# Patient Record
Sex: Male | Born: 1940 | Race: White | Hispanic: No | Marital: Married
Health system: Southern US, Community
[De-identification: ages and names within clinical notes are randomized; demographics above are authoritative.]

---

## 2004-06-09 ENCOUNTER — Inpatient Hospital Stay (HOSPITAL_COMMUNITY): Admission: RE | Admit: 2004-06-09 | Discharge: 2004-06-15 | Payer: Self-pay | Admitting: Surgery

## 2004-09-12 ENCOUNTER — Encounter: Payer: Self-pay | Admitting: *Deleted

## 2004-09-28 ENCOUNTER — Encounter: Payer: Self-pay | Admitting: *Deleted

## 2007-06-25 ENCOUNTER — Ambulatory Visit: Payer: Self-pay | Admitting: Urology

## 2007-06-30 ENCOUNTER — Other Ambulatory Visit: Payer: Self-pay

## 2007-06-30 ENCOUNTER — Emergency Department: Payer: Self-pay | Admitting: Emergency Medicine

## 2007-07-03 ENCOUNTER — Ambulatory Visit: Payer: Self-pay | Admitting: Urology

## 2007-07-11 ENCOUNTER — Ambulatory Visit: Payer: Self-pay | Admitting: Urology

## 2007-07-18 ENCOUNTER — Ambulatory Visit: Payer: Self-pay | Admitting: Urology

## 2007-07-20 ENCOUNTER — Emergency Department: Payer: Self-pay

## 2007-10-20 ENCOUNTER — Ambulatory Visit: Payer: Self-pay | Admitting: Urology

## 2009-11-23 ENCOUNTER — Ambulatory Visit: Payer: Self-pay | Admitting: Ophthalmology

## 2009-11-23 ENCOUNTER — Ambulatory Visit: Payer: Self-pay | Admitting: Internal Medicine

## 2009-11-24 ENCOUNTER — Ambulatory Visit: Payer: Self-pay | Admitting: Ophthalmology

## 2009-12-07 ENCOUNTER — Ambulatory Visit: Payer: Self-pay | Admitting: Ophthalmology

## 2010-05-18 ENCOUNTER — Ambulatory Visit: Payer: Self-pay | Admitting: Unknown Physician Specialty

## 2010-05-19 LAB — PATHOLOGY REPORT

## 2010-10-17 ENCOUNTER — Other Ambulatory Visit: Payer: Self-pay | Admitting: Podiatry

## 2010-10-25 ENCOUNTER — Inpatient Hospital Stay: Payer: Self-pay | Admitting: Podiatry

## 2010-11-14 ENCOUNTER — Ambulatory Visit: Payer: Self-pay | Admitting: Vascular Surgery

## 2010-11-23 ENCOUNTER — Ambulatory Visit: Payer: Self-pay | Admitting: Podiatry

## 2010-11-24 ENCOUNTER — Ambulatory Visit: Payer: Self-pay | Admitting: Podiatry

## 2010-12-22 ENCOUNTER — Ambulatory Visit: Payer: Self-pay | Admitting: Podiatry

## 2010-12-25 LAB — PATHOLOGY REPORT

## 2011-01-12 ENCOUNTER — Ambulatory Visit: Payer: Self-pay | Admitting: Podiatry

## 2011-01-19 ENCOUNTER — Ambulatory Visit: Payer: Self-pay | Admitting: Podiatry

## 2011-01-23 LAB — PATHOLOGY REPORT

## 2011-09-13 ENCOUNTER — Ambulatory Visit: Payer: Self-pay | Admitting: Urology

## 2011-09-17 ENCOUNTER — Ambulatory Visit: Payer: Self-pay | Admitting: Urology

## 2011-10-10 ENCOUNTER — Ambulatory Visit: Payer: Self-pay | Admitting: Vascular Surgery

## 2011-10-19 ENCOUNTER — Ambulatory Visit: Payer: Self-pay | Admitting: Podiatry

## 2011-10-24 LAB — PATHOLOGY REPORT

## 2011-10-27 ENCOUNTER — Inpatient Hospital Stay: Payer: Self-pay | Admitting: Internal Medicine

## 2011-10-28 DIAGNOSIS — R031 Nonspecific low blood-pressure reading: Secondary | ICD-10-CM

## 2011-10-29 DIAGNOSIS — R031 Nonspecific low blood-pressure reading: Secondary | ICD-10-CM

## 2011-10-30 LAB — BASIC METABOLIC PANEL
Anion Gap: 15 (ref 7–16)
Calcium, Total: 8.4 mg/dL — ABNORMAL LOW (ref 8.5–10.1)
Creatinine: 2.57 mg/dL — ABNORMAL HIGH (ref 0.60–1.30)
EGFR (African American): 32 — ABNORMAL LOW
EGFR (Non-African Amer.): 27 — ABNORMAL LOW
Glucose: 143 mg/dL — ABNORMAL HIGH (ref 65–99)
Sodium: 145 mmol/L (ref 136–145)

## 2011-10-30 LAB — PROTEIN / CREATININE RATIO, URINE: Protein, Random Urine: 117 mg/dL — ABNORMAL HIGH (ref 0–12)

## 2011-10-30 LAB — CBC WITH DIFFERENTIAL/PLATELET
Basophil #: 0 10*3/uL (ref 0.0–0.1)
Eosinophil #: 0 10*3/uL (ref 0.0–0.7)
HCT: 33.6 % — ABNORMAL LOW (ref 40.0–52.0)
Lymphocyte %: 12.3 %
MCHC: 33.3 g/dL (ref 32.0–36.0)
Monocyte %: 9.2 %
Neutrophil %: 77.6 %
RDW: 15 % — ABNORMAL HIGH (ref 11.5–14.5)

## 2011-10-30 LAB — APTT
Activated PTT: 115.3 secs — ABNORMAL HIGH (ref 23.6–35.9)
Activated PTT: >160 secs (ref 23.6–35.9)

## 2011-10-30 LAB — PROTIME-INR: INR: 2.6

## 2011-10-31 LAB — PATHOLOGY REPORT

## 2011-10-31 LAB — CBC WITH DIFFERENTIAL/PLATELET
Comment - H1-Com2: NORMAL
HCT: 32 % — ABNORMAL LOW (ref 40.0–52.0)
MCH: 28.6 pg (ref 26.0–34.0)
MCHC: 33.1 g/dL (ref 32.0–36.0)
RDW: 15.1 % — ABNORMAL HIGH (ref 11.5–14.5)
Segmented Neutrophils: 81 %

## 2011-10-31 LAB — BASIC METABOLIC PANEL
Chloride: 103 mmol/L (ref 98–107)
Co2: 24 mmol/L (ref 21–32)
Creatinine: 3.37 mg/dL — ABNORMAL HIGH (ref 0.60–1.30)
Potassium: 3.9 mmol/L (ref 3.5–5.1)
Sodium: 141 mmol/L (ref 136–145)

## 2011-10-31 LAB — PROTIME-INR
INR: 2.9
Prothrombin Time: 29.4 secs — ABNORMAL HIGH (ref 11.5–14.7)

## 2011-10-31 LAB — APTT: Activated PTT: 104 secs — ABNORMAL HIGH (ref 23.6–35.9)

## 2011-10-31 LAB — MAGNESIUM: Magnesium: 2.3 mg/dL

## 2011-11-01 LAB — CBC WITH DIFFERENTIAL/PLATELET
Basophil %: 0.3 %
Eosinophil #: 0.1 10*3/uL (ref 0.0–0.7)
HCT: 30.8 % — ABNORMAL LOW (ref 40.0–52.0)
HGB: 10.2 g/dL — ABNORMAL LOW (ref 13.0–18.0)
Lymphocyte %: 12.3 %
MCH: 28.7 pg (ref 26.0–34.0)
MCHC: 33.1 g/dL (ref 32.0–36.0)
Monocyte #: 0.6 10*3/uL (ref 0.0–0.7)
Neutrophil #: 5.9 10*3/uL (ref 1.4–6.5)

## 2011-11-01 LAB — BASIC METABOLIC PANEL
Anion Gap: 15 (ref 7–16)
BUN: 64 mg/dL — ABNORMAL HIGH (ref 7–18)
Chloride: 108 mmol/L — ABNORMAL HIGH (ref 98–107)
Creatinine: 3.59 mg/dL — ABNORMAL HIGH (ref 0.60–1.30)
EGFR (African American): 22 — ABNORMAL LOW
Osmolality: 315 (ref 275–301)

## 2011-11-01 LAB — APTT: Activated PTT: 145.4 secs — ABNORMAL HIGH (ref 23.6–35.9)

## 2011-11-02 LAB — BASIC METABOLIC PANEL
Anion Gap: 13 (ref 7–16)
BUN: 62 mg/dL — ABNORMAL HIGH (ref 7–18)
Calcium, Total: 8.4 mg/dL — ABNORMAL LOW (ref 8.5–10.1)
EGFR (African American): 26 — ABNORMAL LOW
EGFR (Non-African Amer.): 21 — ABNORMAL LOW
Glucose: 338 mg/dL — ABNORMAL HIGH (ref 65–99)
Osmolality: 320 (ref 275–301)

## 2011-11-03 LAB — CBC WITH DIFFERENTIAL/PLATELET
Basophil %: 0.3 %
Eosinophil #: 0.1 10*3/uL (ref 0.0–0.7)
Eosinophil %: 1.2 %
HGB: 10.5 g/dL — ABNORMAL LOW (ref 13.0–18.0)
Lymphocyte #: 1 10*3/uL (ref 1.0–3.6)
MCH: 28.7 pg (ref 26.0–34.0)
MCHC: 33.5 g/dL (ref 32.0–36.0)
MCV: 86 fL (ref 80–100)
Monocyte #: 0.6 10*3/uL (ref 0.0–0.7)
Neutrophil %: 82.5 %
Platelet: 305 10*3/uL (ref 150–440)
RBC: 3.65 10*6/uL — ABNORMAL LOW (ref 4.40–5.90)

## 2011-11-03 LAB — BASIC METABOLIC PANEL
BUN: 55 mg/dL — ABNORMAL HIGH (ref 7–18)
Calcium, Total: 8.7 mg/dL (ref 8.5–10.1)
EGFR (African American): 28 — ABNORMAL LOW
EGFR (Non-African Amer.): 23 — ABNORMAL LOW
Glucose: 99 mg/dL (ref 65–99)
Sodium: 143 mmol/L (ref 136–145)

## 2011-11-04 LAB — RENAL FUNCTION PANEL
Albumin: 2.6 g/dL — ABNORMAL LOW (ref 3.4–5.0)
Calcium, Total: 8.8 mg/dL (ref 8.5–10.1)
Chloride: 107 mmol/L (ref 98–107)
Co2: 25 mmol/L (ref 21–32)
Phosphorus: 4.2 mg/dL (ref 2.5–4.9)
Potassium: 4 mmol/L (ref 3.5–5.1)

## 2011-11-04 LAB — PROTIME-INR
INR: 1.3
Prothrombin Time: 16.9 secs — ABNORMAL HIGH (ref 11.5–14.7)

## 2011-11-05 LAB — CBC WITH DIFFERENTIAL/PLATELET
Basophil #: 0 10*3/uL (ref 0.0–0.1)
Basophil %: 0.4 %
Eosinophil %: 2 %
HCT: 31.9 % — ABNORMAL LOW (ref 40.0–52.0)
HGB: 10.8 g/dL — ABNORMAL LOW (ref 13.0–18.0)
Lymphocyte #: 1.2 10*3/uL (ref 1.0–3.6)
MCH: 28.9 pg (ref 26.0–34.0)
MCV: 85 fL (ref 80–100)
Monocyte #: 0.7 10*3/uL (ref 0.0–0.7)
Monocyte %: 9 %
Neutrophil #: 5.2 10*3/uL (ref 1.4–6.5)
Platelet: 302 10*3/uL (ref 150–440)
RBC: 3.75 10*6/uL — ABNORMAL LOW (ref 4.40–5.90)
WBC: 7.3 10*3/uL (ref 3.8–10.6)

## 2011-11-05 LAB — BASIC METABOLIC PANEL
Anion Gap: 13 (ref 7–16)
Calcium, Total: 9 mg/dL (ref 8.5–10.1)
Co2: 27 mmol/L (ref 21–32)
Creatinine: 2.44 mg/dL — ABNORMAL HIGH (ref 0.60–1.30)
EGFR (African American): 34 — ABNORMAL LOW
Osmolality: 311 (ref 275–301)

## 2011-11-05 LAB — PROTIME-INR
INR: 1.3
Prothrombin Time: 16.4 secs — ABNORMAL HIGH (ref 11.5–14.7)

## 2011-11-06 LAB — BASIC METABOLIC PANEL
Anion Gap: 9 (ref 7–16)
Calcium, Total: 9.2 mg/dL (ref 8.5–10.1)
Chloride: 104 mmol/L (ref 98–107)
Co2: 29 mmol/L (ref 21–32)
Osmolality: 304 (ref 275–301)

## 2011-11-06 LAB — CBC WITH DIFFERENTIAL/PLATELET
Eosinophil #: 0.2 10*3/uL (ref 0.0–0.7)
Eosinophil %: 1.9 %
HCT: 34.3 % — ABNORMAL LOW (ref 40.0–52.0)
Lymphocyte %: 17.9 %
Monocyte %: 7.9 %
Platelet: 315 10*3/uL (ref 150–440)
RBC: 4 10*6/uL — ABNORMAL LOW (ref 4.40–5.90)
RDW: 15.1 % — ABNORMAL HIGH (ref 11.5–14.5)
WBC: 8.8 10*3/uL (ref 3.8–10.6)

## 2011-11-06 LAB — PROTIME-INR: Prothrombin Time: 15.4 secs — ABNORMAL HIGH (ref 11.5–14.7)

## 2011-11-07 LAB — URINALYSIS, COMPLETE
Bacteria: NONE SEEN
Bilirubin,UR: NEGATIVE
Blood: NEGATIVE
Ketone: NEGATIVE
Protein: NEGATIVE
RBC,UR: <1 /HPF (ref 0–5)
Specific Gravity: 1.008 (ref 1.003–1.030)
Squamous Epithelial: NONE SEEN
WBC UR: 2 /HPF (ref 0–5)

## 2011-11-07 LAB — BASIC METABOLIC PANEL
Anion Gap: 11 (ref 7–16)
Calcium, Total: 9.2 mg/dL (ref 8.5–10.1)
Chloride: 102 mmol/L (ref 98–107)
Co2: 27 mmol/L (ref 21–32)
Potassium: 4 mmol/L (ref 3.5–5.1)
Sodium: 140 mmol/L (ref 136–145)

## 2011-11-07 LAB — PROTIME-INR: Prothrombin Time: 17.5 secs — ABNORMAL HIGH (ref 11.5–14.7)

## 2011-11-08 LAB — BASIC METABOLIC PANEL
Anion Gap: 9 (ref 7–16)
BUN: 58 mg/dL — ABNORMAL HIGH (ref 7–18)
Chloride: 103 mmol/L (ref 98–107)
Creatinine: 2.36 mg/dL — ABNORMAL HIGH (ref 0.60–1.30)
EGFR (African American): 35 — ABNORMAL LOW
Osmolality: 305 (ref 275–301)

## 2011-11-08 LAB — PROTIME-INR
INR: 2
Prothrombin Time: 22.7 secs — ABNORMAL HIGH (ref 11.5–14.7)

## 2011-11-08 LAB — LIPID PANEL
HDL Cholesterol: 33 mg/dL — ABNORMAL LOW (ref 40–60)
Ldl Cholesterol, Calc: 52 mg/dL (ref 0–100)
Triglycerides: 241 mg/dL — ABNORMAL HIGH (ref 0–200)
VLDL Cholesterol, Calc: 48 mg/dL — ABNORMAL HIGH (ref 5–40)

## 2011-11-08 LAB — APTT: Activated PTT: >160 secs (ref 23.6–35.9)

## 2011-11-09 LAB — APTT: Activated PTT: 158.2 secs — ABNORMAL HIGH (ref 23.6–35.9)

## 2011-11-26 ENCOUNTER — Emergency Department: Payer: Self-pay | Admitting: Emergency Medicine

## 2011-11-26 LAB — COMPREHENSIVE METABOLIC PANEL
Albumin: 3.7 g/dL (ref 3.4–5.0)
Alkaline Phosphatase: 93 U/L (ref 50–136)
BUN: 25 mg/dL — ABNORMAL HIGH (ref 7–18)
Bilirubin,Total: 0.4 mg/dL (ref 0.2–1.0)
Calcium, Total: 9.7 mg/dL (ref 8.5–10.1)
Creatinine: 1.54 mg/dL — ABNORMAL HIGH (ref 0.60–1.30)
EGFR (Non-African Amer.): 48 — ABNORMAL LOW
Glucose: 140 mg/dL — ABNORMAL HIGH (ref 65–99)
Osmolality: 286 (ref 275–301)
Potassium: 3.8 mmol/L (ref 3.5–5.1)
SGPT (ALT): 36 U/L
Sodium: 140 mmol/L (ref 136–145)
Total Protein: 7.6 g/dL (ref 6.4–8.2)

## 2011-11-26 LAB — CK TOTAL AND CKMB (NOT AT ARMC): CK, Total: 69 U/L (ref 35–232)

## 2011-11-26 LAB — URINALYSIS, COMPLETE
Bilirubin,UR: NEGATIVE
Ketone: NEGATIVE
Leukocyte Esterase: NEGATIVE
Ph: 5 (ref 4.5–8.0)
Protein: NEGATIVE
RBC,UR: 2 /HPF (ref 0–5)
WBC UR: 2 /HPF (ref 0–5)

## 2011-11-26 LAB — CBC
HCT: 36.9 % — ABNORMAL LOW (ref 40.0–52.0)
MCV: 86 fL (ref 80–100)
RBC: 4.29 10*6/uL — ABNORMAL LOW (ref 4.40–5.90)
WBC: 9.1 10*3/uL (ref 3.8–10.6)

## 2011-11-26 LAB — TROPONIN I: Troponin-I: 0.03 ng/mL

## 2012-02-04 ENCOUNTER — Inpatient Hospital Stay: Payer: Self-pay | Admitting: Vascular Surgery

## 2012-02-04 LAB — CBC WITH DIFFERENTIAL/PLATELET
Basophil #: 0 10*3/uL (ref 0.0–0.1)
Eosinophil #: 0 10*3/uL (ref 0.0–0.7)
Eosinophil %: 0.3 %
HCT: 34.5 % — ABNORMAL LOW (ref 40.0–52.0)
HGB: 11.6 g/dL — ABNORMAL LOW (ref 13.0–18.0)
Lymphocyte %: 17 %
MCH: 28.4 pg (ref 26.0–34.0)
MCHC: 33.6 g/dL (ref 32.0–36.0)
Monocyte #: 0.8 10*3/uL — ABNORMAL HIGH (ref 0.0–0.7)
Neutrophil #: 7.6 10*3/uL — ABNORMAL HIGH (ref 1.4–6.5)
Neutrophil %: 74.9 %
Platelet: 282 10*3/uL (ref 150–440)
RBC: 4.07 10*6/uL — ABNORMAL LOW (ref 4.40–5.90)
RDW: 16.4 % — ABNORMAL HIGH (ref 11.5–14.5)

## 2012-02-04 LAB — PROTIME-INR
INR: 2.3
Prothrombin Time: 25.5 secs — ABNORMAL HIGH (ref 11.5–14.7)

## 2012-02-04 LAB — APTT: Activated PTT: 109.2 secs — ABNORMAL HIGH (ref 23.6–35.9)

## 2012-02-05 LAB — BASIC METABOLIC PANEL
Chloride: 105 mmol/L (ref 98–107)
Co2: 25 mmol/L (ref 21–32)
Creatinine: 1.25 mg/dL (ref 0.60–1.30)
EGFR (African American): >60
EGFR (Non-African Amer.): >60
Glucose: 195 mg/dL — ABNORMAL HIGH (ref 65–99)
Osmolality: 291 (ref 275–301)
Sodium: 141 mmol/L (ref 136–145)

## 2012-02-05 LAB — PROTIME-INR
INR: 2.1
Prothrombin Time: 23.6 secs — ABNORMAL HIGH (ref 11.5–14.7)

## 2012-02-05 LAB — CBC WITH DIFFERENTIAL/PLATELET
Basophil #: 0 10*3/uL (ref 0.0–0.1)
Basophil %: 0.6 %
Eosinophil %: 0.6 %
HCT: 29.8 % — ABNORMAL LOW (ref 40.0–52.0)
HGB: 10.1 g/dL — ABNORMAL LOW (ref 13.0–18.0)
Lymphocyte #: 2.1 10*3/uL (ref 1.0–3.6)
Lymphocyte %: 28.3 %
MCH: 28.1 pg (ref 26.0–34.0)
MCHC: 33.8 g/dL (ref 32.0–36.0)
Monocyte #: 0.6 10*3/uL (ref 0.0–0.7)
Neutrophil #: 4.5 10*3/uL (ref 1.4–6.5)
Neutrophil %: 61.9 %
Platelet: 239 10*3/uL (ref 150–440)
RBC: 3.58 10*6/uL — ABNORMAL LOW (ref 4.40–5.90)
RDW: 15.7 % — ABNORMAL HIGH (ref 11.5–14.5)

## 2012-02-05 LAB — APTT: Activated PTT: 69.1 secs — ABNORMAL HIGH (ref 23.6–35.9)

## 2012-02-06 LAB — PROTIME-INR: Prothrombin Time: 19.2 secs — ABNORMAL HIGH (ref 11.5–14.7)

## 2012-02-07 LAB — PATHOLOGY REPORT

## 2012-02-07 LAB — BASIC METABOLIC PANEL
Anion Gap: 10 (ref 7–16)
Calcium, Total: 8.8 mg/dL (ref 8.5–10.1)
Creatinine: 1 mg/dL (ref 0.60–1.30)
EGFR (African American): >60
EGFR (Non-African Amer.): >60
Glucose: 251 mg/dL — ABNORMAL HIGH (ref 65–99)
Osmolality: 286 (ref 275–301)

## 2012-02-07 LAB — HEMOGLOBIN: HGB: 10.1 g/dL — ABNORMAL LOW (ref 13.0–18.0)

## 2012-02-07 LAB — PLATELET COUNT: Platelet: 253 10*3/uL (ref 150–440)

## 2012-02-07 LAB — APTT: Activated PTT: 69.4 secs — ABNORMAL HIGH (ref 23.6–35.9)

## 2012-02-08 LAB — APTT: Activated PTT: 71.3 secs — ABNORMAL HIGH (ref 23.6–35.9)

## 2012-02-09 LAB — PROTIME-INR
INR: 1.8
Prothrombin Time: 21.2 secs — ABNORMAL HIGH (ref 11.5–14.7)

## 2012-02-09 LAB — HEMOGLOBIN: HGB: 9.9 g/dL — ABNORMAL LOW (ref 13.0–18.0)

## 2012-02-09 LAB — PLATELET COUNT: Platelet: 260 10*3/uL (ref 150–440)

## 2012-02-11 LAB — PLATELET COUNT: Platelet: 284 10*3/uL (ref 150–440)

## 2012-02-11 LAB — APTT: Activated PTT: 114.3 secs — ABNORMAL HIGH (ref 23.6–35.9)

## 2012-02-11 LAB — PROTIME-INR: INR: 2.9

## 2012-02-11 LAB — HEMOGLOBIN: HGB: 9.7 g/dL — ABNORMAL LOW (ref 13.0–18.0)

## 2012-03-11 ENCOUNTER — Inpatient Hospital Stay: Payer: Self-pay | Admitting: Vascular Surgery

## 2012-03-11 LAB — COMPREHENSIVE METABOLIC PANEL
Albumin: 3.2 g/dL — ABNORMAL LOW (ref 3.4–5.0)
Alkaline Phosphatase: 82 U/L (ref 50–136)
Anion Gap: 9 (ref 7–16)
Co2: 25 mmol/L (ref 21–32)
Creatinine: 1.38 mg/dL — ABNORMAL HIGH (ref 0.60–1.30)
EGFR (African American): >60
Glucose: 177 mg/dL — ABNORMAL HIGH (ref 65–99)
Potassium: 4.3 mmol/L (ref 3.5–5.1)
SGOT(AST): 28 U/L (ref 15–37)
SGPT (ALT): 23 U/L
Total Protein: 7 g/dL (ref 6.4–8.2)

## 2012-03-11 LAB — CBC WITH DIFFERENTIAL/PLATELET
Basophil #: 0.1 10*3/uL (ref 0.0–0.1)
Basophil %: 0.7 %
Eosinophil #: 0.1 10*3/uL (ref 0.0–0.7)
HCT: 34.8 % — ABNORMAL LOW (ref 40.0–52.0)
Lymphocyte #: 1.6 10*3/uL (ref 1.0–3.6)
Lymphocyte %: 18.9 %
MCHC: 33.1 g/dL (ref 32.0–36.0)
MCV: 84 fL (ref 80–100)
Neutrophil #: 6.3 10*3/uL (ref 1.4–6.5)
Neutrophil %: 71.9 %
Platelet: 157 10*3/uL (ref 150–440)
RDW: 18.1 % — ABNORMAL HIGH (ref 11.5–14.5)
WBC: 8.7 10*3/uL (ref 3.8–10.6)

## 2012-03-12 LAB — PROTIME-INR
INR: 2.5
Prothrombin Time: 27 secs — ABNORMAL HIGH (ref 11.5–14.7)

## 2012-03-13 LAB — PROTIME-INR
INR: 1.6
Prothrombin Time: 19.5 secs — ABNORMAL HIGH (ref 11.5–14.7)

## 2012-03-14 LAB — PROTIME-INR: INR: 1.5

## 2012-03-21 ENCOUNTER — Other Ambulatory Visit: Payer: Self-pay | Admitting: Vascular Surgery

## 2012-03-26 LAB — WOUND AEROBIC CULTURE

## 2012-04-02 ENCOUNTER — Ambulatory Visit: Payer: Self-pay | Admitting: Vascular Surgery

## 2012-04-02 LAB — PROTIME-INR: INR: 1.7

## 2012-10-20 ENCOUNTER — Ambulatory Visit: Payer: Medicare Other | Attending: Vascular Surgery | Admitting: Physical Therapy

## 2012-10-20 DIAGNOSIS — S88119A Complete traumatic amputation at level between knee and ankle, unspecified lower leg, initial encounter: Secondary | ICD-10-CM | POA: Insufficient documentation

## 2012-10-20 DIAGNOSIS — IMO0001 Reserved for inherently not codable concepts without codable children: Secondary | ICD-10-CM | POA: Insufficient documentation

## 2012-10-20 DIAGNOSIS — M6281 Muscle weakness (generalized): Secondary | ICD-10-CM | POA: Insufficient documentation

## 2012-10-20 DIAGNOSIS — R269 Unspecified abnormalities of gait and mobility: Secondary | ICD-10-CM | POA: Insufficient documentation

## 2012-10-20 DIAGNOSIS — R5381 Other malaise: Secondary | ICD-10-CM | POA: Insufficient documentation

## 2012-10-23 ENCOUNTER — Ambulatory Visit: Payer: Medicare Other | Admitting: Physical Therapy

## 2012-11-04 ENCOUNTER — Ambulatory Visit: Payer: Medicare Other | Admitting: Physical Therapy

## 2012-11-06 ENCOUNTER — Ambulatory Visit: Payer: Medicare Other | Attending: Vascular Surgery | Admitting: Physical Therapy

## 2012-11-06 DIAGNOSIS — S88119A Complete traumatic amputation at level between knee and ankle, unspecified lower leg, initial encounter: Secondary | ICD-10-CM | POA: Insufficient documentation

## 2012-11-06 DIAGNOSIS — R5381 Other malaise: Secondary | ICD-10-CM | POA: Insufficient documentation

## 2012-11-06 DIAGNOSIS — R269 Unspecified abnormalities of gait and mobility: Secondary | ICD-10-CM | POA: Insufficient documentation

## 2012-11-06 DIAGNOSIS — M6281 Muscle weakness (generalized): Secondary | ICD-10-CM | POA: Insufficient documentation

## 2012-11-06 DIAGNOSIS — Z5189 Encounter for other specified aftercare: Secondary | ICD-10-CM | POA: Insufficient documentation

## 2012-11-11 ENCOUNTER — Ambulatory Visit: Payer: Medicare Other | Admitting: Physical Therapy

## 2012-11-18 ENCOUNTER — Ambulatory Visit: Payer: Medicare Other | Admitting: Physical Therapy

## 2012-11-25 ENCOUNTER — Ambulatory Visit: Payer: Medicare Other | Admitting: Physical Therapy

## 2012-12-02 ENCOUNTER — Ambulatory Visit: Payer: Medicare Other | Admitting: Physical Therapy

## 2012-12-09 ENCOUNTER — Encounter: Payer: Medicare Other | Admitting: Physical Therapy

## 2013-05-19 IMAGING — XA IR VASCULAR PROCEDURE
11 of 15 series · 15 of 24 positions shown · IV contrast (IODINE)
Comparison: none

[Series 2: care aorta · 1 of 2 slices shown]
[im 1/2]
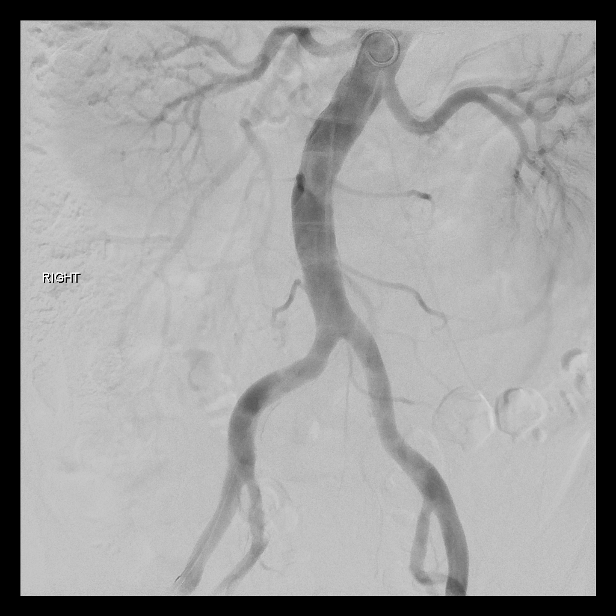

[Series 4: care sfa · 1 of 3 slices shown (1 of 9)]
[im 1/3]
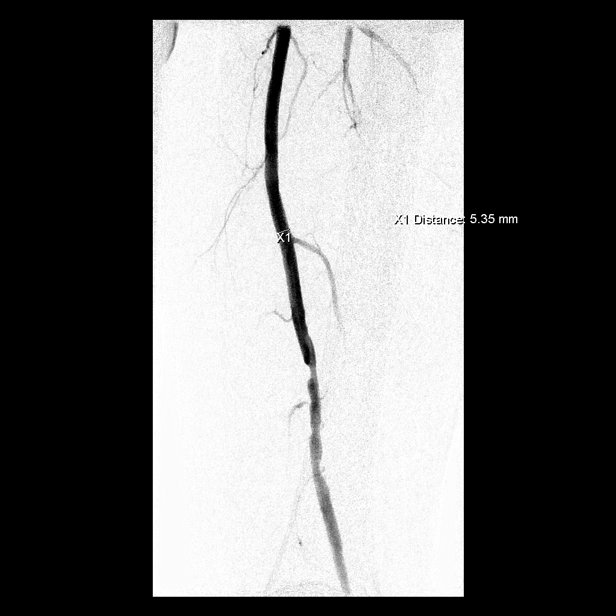

[Series 6: care sfa · 2 of 2 slices shown (2 of 9)]
[im 1/2]
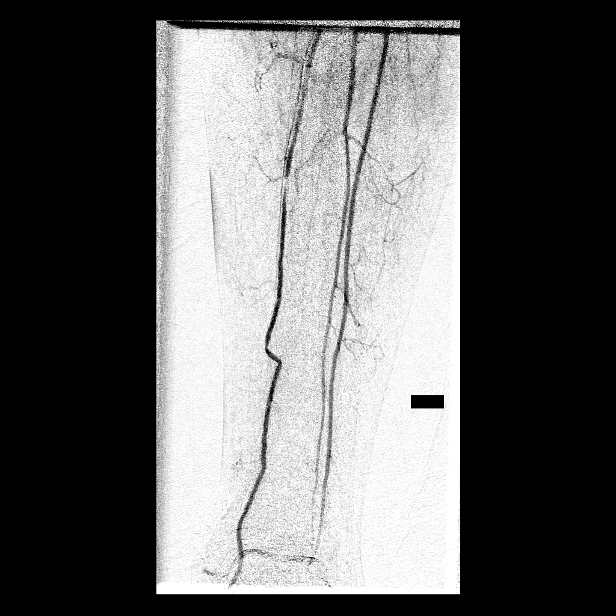
[im 2/2]
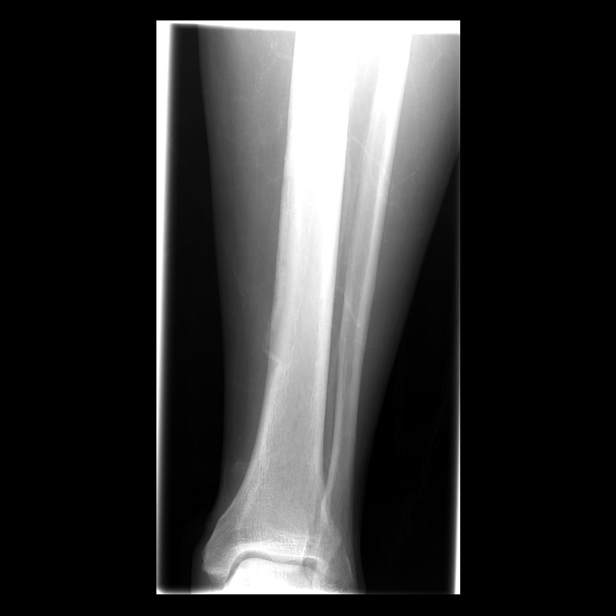

[Series 7: care sfa · 1 of 2 slices shown (3 of 9)]
[im 2/2]
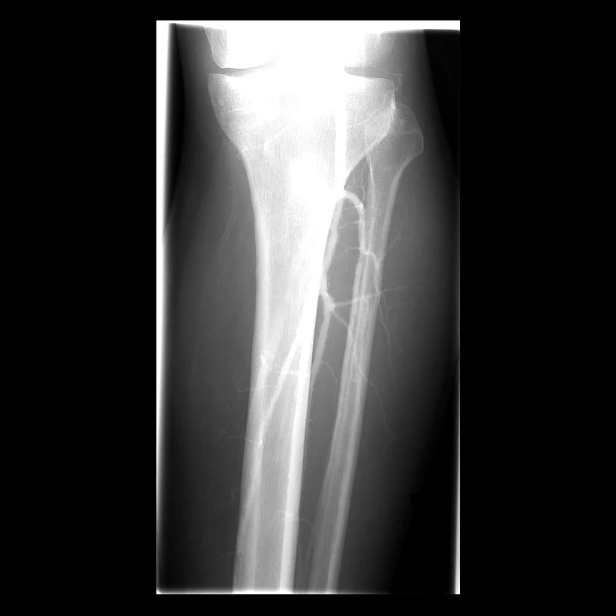

[Series 8: care sfa · 1 of 2 slices shown (4 of 9)]
[im 1/2]
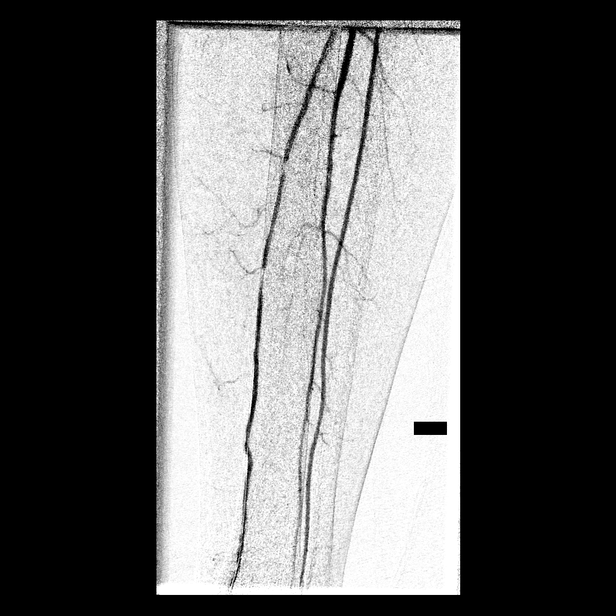

[Series 9: fl - angio · 1 of 2 slices shown]
[im 1/2]
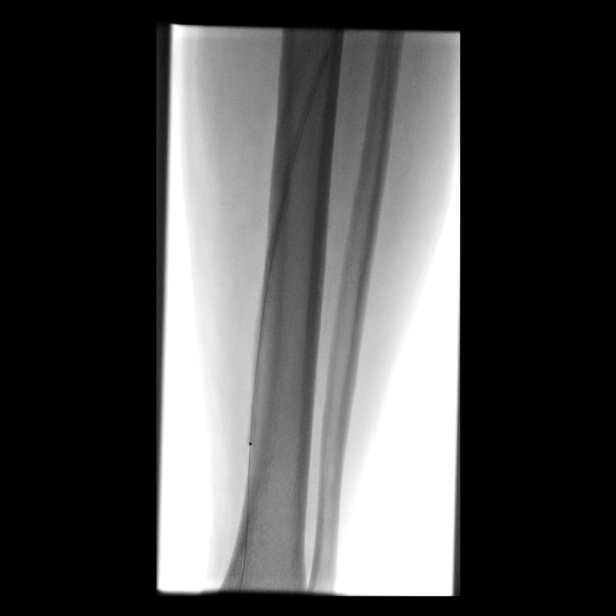

[Series 10: care sfa · 2 of 2 slices shown (5 of 9)]
[im 1/2]
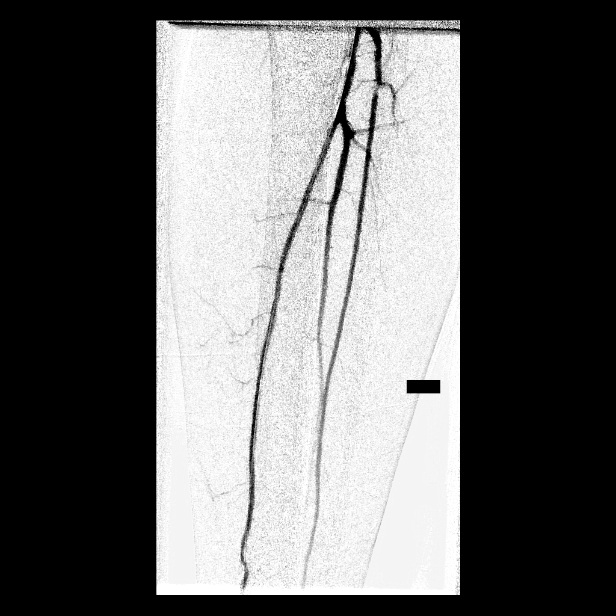
[im 2/2]
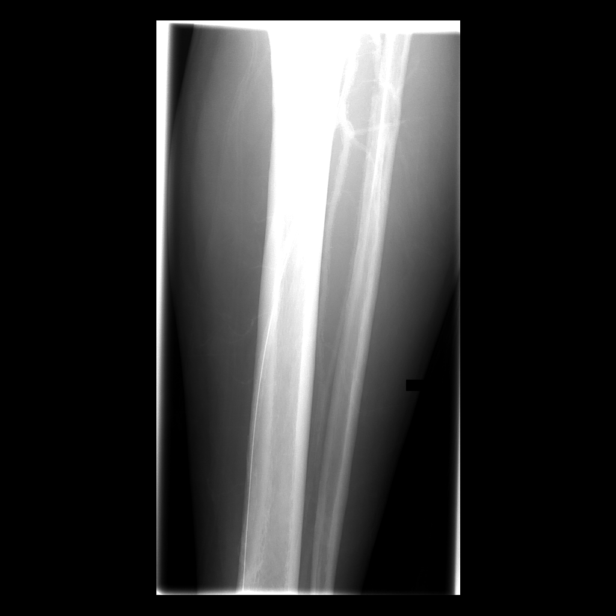

[Series 12: care sfa · 2 of 2 slices shown (6 of 9)]
[im 1/2]
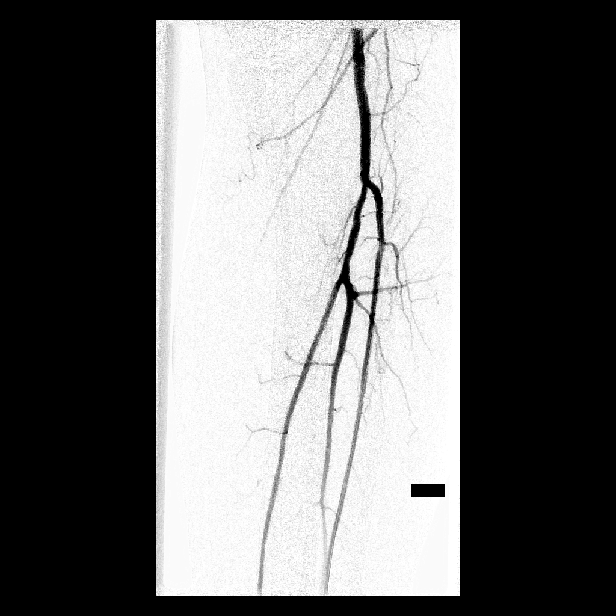
[im 2/2]
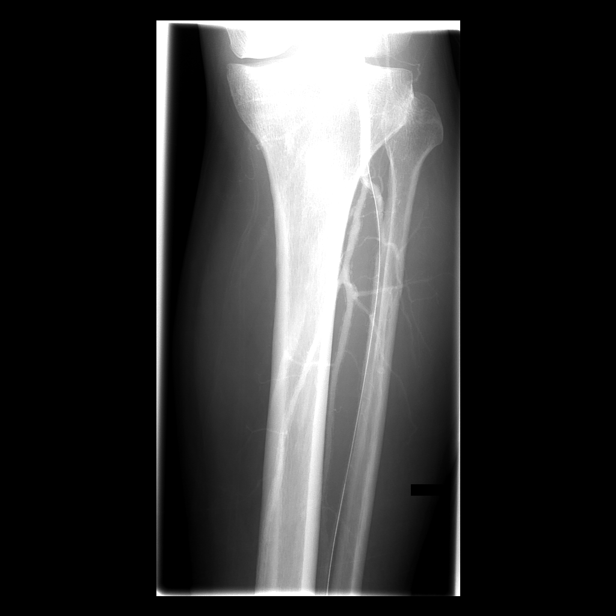

[Series 13: care sfa · 1 of 2 slices shown (7 of 9)]
[im 2/2]
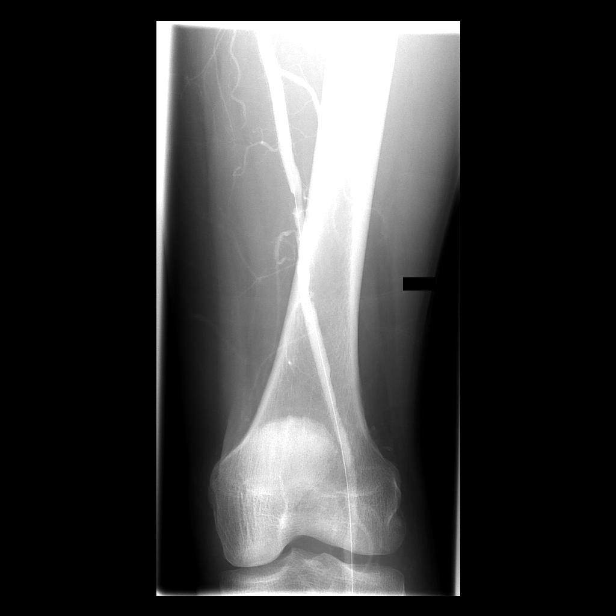

[Series 15: care sfa · 2 of 2 slices shown (8 of 9)]
[im 1/2]
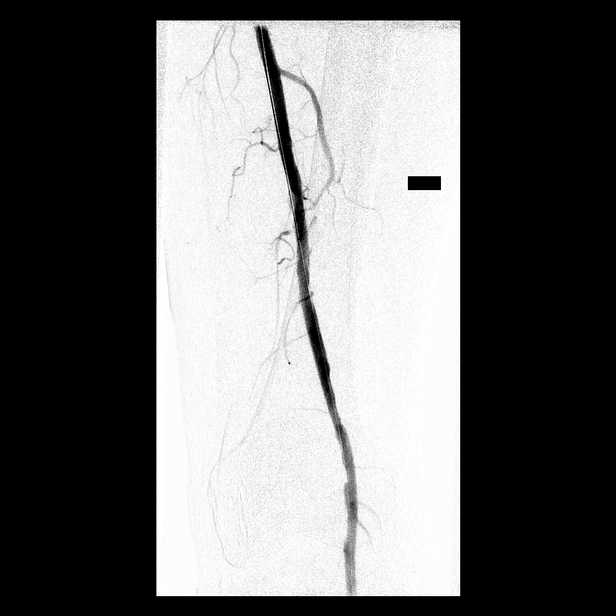
[im 2/2]
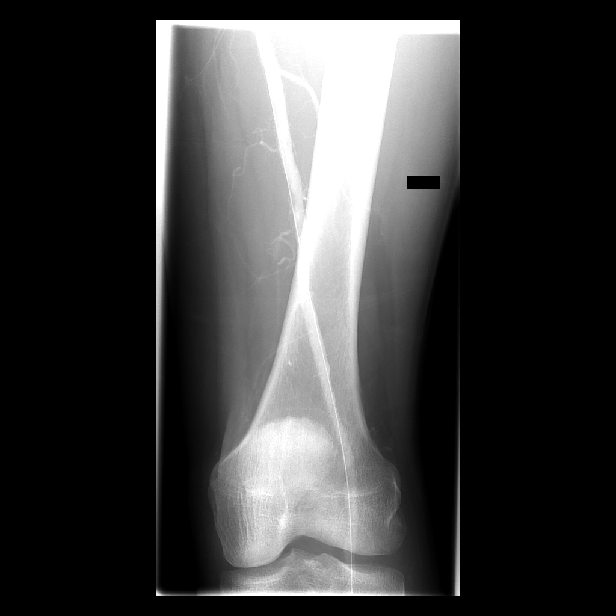

[Series 16: care sfa · 1 of 2 slices shown (9 of 9)]
[im 2/2]
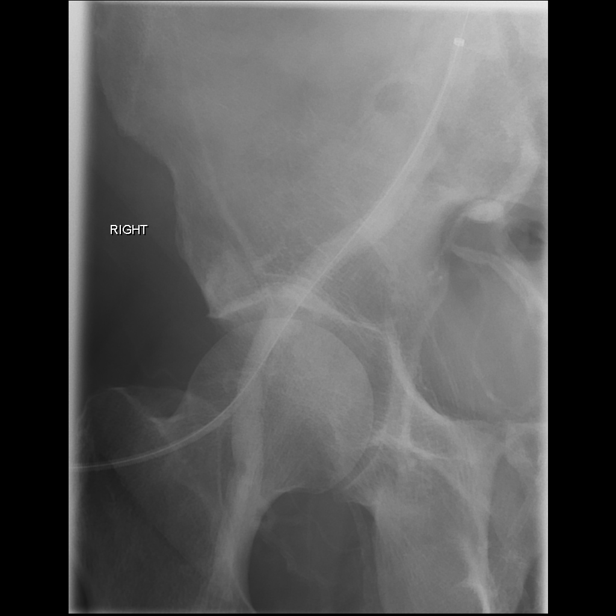

[15 of 24 positions shown; findings below may reference images not displayed]

IMAGES IMPORTED FROM THE [employer] WORKFLOW SYSTEM
NO DICTATION FOR STUDY

## 2013-05-29 DEATH — deceased

## 2015-02-20 NOTE — Op Note (Signed)
PATIENT NAME:  Joe [name], Joe [name] MR#:  [id number] DATE OF BIRTH:  Aug 09, 1941  DATE OF PROCEDURE:  02/06/2012  PREOPERATIVE DIAGNOSES:  1. Gangrene, left foot.  2. Diabetes.  3. Peripheral arterial disease.  4. History of renal failure with contrast exposure.   POSTOPERATIVE DIAGNOSES: 1. Gangrene, left foot.  2. Diabetes.  3. Peripheral arterial disease.  4. History of renal failure with contrast exposure.   PROCEDURE: Left below-knee amputation.   SURGEON: Annice NeedyJason S. Dew, MD   ANESTHESIA: General.   ESTIMATED BLOOD LOSS: Approximately 100 mL.   INDICATION FOR PROCEDURE: This is a 74 year old white male with a gangrenous left foot. We discussed options for limb salvage which were somewhat limited and due to his previous issues with renal insufficiency with contrast he was not interested in angiography or revascularization for limb salvage. He elected to have a primary amputation which was reasonable. Risks and benefits were discussed. Informed consent was obtained.   DESCRIPTION OF PROCEDURE: The patient was brought to the operative suite and after an adequate level of general endotracheal anesthesia was obtained, the left lower extremity was sterilely prepped and draped and a sterile surgical field was created. An incision was created 2 to 3 fingerbreadths below the tibial tuberosity on the left and a long posterior flap was created to use the gastrocnemius and psoas muscles for coverage. The tibia was dissected out and periosteal elevator was used to dissect this out several centimeters cephalad to the incision. I then dissected out the muscular components and the fibula. The neurovascular bundles were identified, stick tied proximally, and ligated distally and the fibula was transected with heavy bone cutters. The oscillating saw was used to transect the tibia and then a beveled anterior edge was created with the oscillating saw as well. The posterior flap was completed with the amputation  knife. All vessels were controlled with hemostats and ligated with 2-0 silk free ties as needed. Electrocautery was used for small vessels. The wound was irrigated. A series of interrupted figure-of-eight 0 Vicryl sutures were used to close the fascia. Subcutaneous tissue was closed with running 2-0 Vicryl sutures and the skin was coapted with staples. Sterile dressing was then placed in the form of Xeroform, ABD, fluff Kerlix wrap, Kerlix, and Ace wrap. The patient was awakened from anesthesia and taken to the recovery room in stable condition.    ____________________________ Annice NeedyJason S. Dew, MD jsd:drc D: 02/06/2012 15:27:53 ET T: 02/06/2012 17:55:42 ET JOB#: [id number]  cc: Annice NeedyJason S. Dew, MD, <Dictator> Argentina DonovanJustin A. Ether GriffinsFowler, DPM Annice NeedyJASON S DEW MD ELECTRONICALLY SIGNED 02/07/2012 12:01

## 2015-02-20 NOTE — Discharge Summary (Signed)
PATIENT NAME:  Joe [name], Joe [name] MR#:  [id number] DATE OF BIRTH:  1940-12-20  DATE OF ADMISSION:  03/11/2012 DATE OF DISCHARGE:  03/14/2012  FINAL DIAGNOSES:  1. Wound dehiscence of left leg stump status post a fall.  2. Hyperlipidemia. 3. Diabetes mellitus.  PROCEDURE PERFORMED DURING ADMISSION: Debridement of left below-the-knee amputation stump by Dr. Wyn Quakerew on 03/12/2012.   COMPLICATIONS: None.  CONSULTANTS: None.  HOSPITAL COURSE: The patient was admitted. He underwent his procedure on 03/12/2012 without complication. Following surgery he was transferred from the post-anesthesia care unit to the surgical floor where vital signs remained stable. He did receive preoperative and postoperative IV antibiotics and required no blood transfusion during this admission. His left BKA incision site remained clean, dry, and intact.  He progressed in his hospital stay and was deemed stable for discharge on 03/14/2012.    LABORATORY DATA: As of 03/14/2012, hemoglobin is 11.5 and hematocrit 34.8. Chemistries appeared stable.    DISCHARGE MEDICATIONS:  1. Hydrochlorothiazide 25 mg oral tablet once daily. 2. Quinapril 40 mg oral tablet p.o. daily. 3. Warfarin 5 mg oral tablet once a day. 4. Acetaminophen/hydrocodone 325/5 mg oral tablet every 4 to 6 hours as needed for pain. 5. Amlodipine 5 mg oral tablet once daily. 6. Simvastatin 20 mg once a day at bedtime. 7. Promethazine 25 mg oral tablet for every 4 hours of nausea and as needed. 8. Humulin N 60 units subcutaneous twice a day. 9. Humulin R 35 units subcutaneous three times daily.  10. Metoprolol succinate 25 mg oral tablet extended-release twice a day.  DISPOSITION: The patient is discharged home in stable condition on 03/14/2012.  DISCHARGE INSTRUCTIONS/FOLLOWUP: Surgical clips may be removed three weeks status post procedure. The patient has a followup appointment in three weeks with Philomena CourseJason Diana Davenport, PA-C for stable removal as well as wound  check. The patient understands to call in the interim with any problems or questions.  ____________________________ [name]. Chrystal Zeimet, PA-C jmk:slb D: 03/26/2012 13:01:00 ET T: 03/26/2012 14:07:26 ET JOB#: [id number]  cc: [name]. Deivi Huckins, PA-C, <Dictator> [name] Almeta Geisel PA ELECTRONICALLY SIGNED 04/10/2012 9:15

## 2015-02-20 NOTE — Op Note (Signed)
PATIENT NAME:  Joe [name], Joe [name] MR#:  [id number] DATE OF BIRTH:  1941/08/06  DATE OF PROCEDURE:  10/19/2011  PREOPERATIVE DIAGNOSIS: Left great toe gangrene.   POSTOPERATIVE DIAGNOSIS: Left great toe gangrene.   PROCEDURE: Amputation left great toe.   SURGEON: Ahmari Garton A. Ether GriffinsFowler, DPM  ANESTHESIA: IV sedation with local.   HEMOSTASIS: None.   COMPLICATIONS: None.   SPECIMEN: Gangrenous left great toe.   OPERATIVE INDICATIONS: This is a 74 year old gentleman who developed gangrenous changes of his left great toe, underwent revascularization by vascular surgery. Presents today for surgical removal of the gangrenous toe. All risks, benefits, alternatives, and complications associated with the procedure were discussed with the patient in full, consent has been given.   DESCRIPTION OF PROCEDURE: Patient was brought into the Operating Room, placed on the operating table in the supine position. IV sedation was administered by the anesthesia team. The left lower extremity was prepped and draped in the usual sterile fashion. Attention was directed to the left great toe where two semi-elliptical incisions were made. Full thickness incision was made taken down to the bone. This was carried back proximal at the metatarsophalangeal joint. Next, with a power saw this was cut at the base of the proximal phalanx. The area was disarticulated and removed from the surgical field. All bleeders were Bovie cauterized. This was flushed with copious amounts of irrigation. Layered closure was performed with a 4-0 Vicryl for the deeper layers and a 3-0 nylon for skin. A well compressive sterile dressing was placed on the left foot. Overall the patient tolerated the procedure and anesthesia well, was transported from the Operating Room to the PAC-U with all vital signs stable and neurovascular status intact.    Will see him in the outpatient clinic in 5 to 7 days.   ____________________________ Argentina DonovanJustin A. Ether GriffinsFowler,  DPM jaf:cms D: 10/19/2011 09:58:04 ET T: 10/19/2011 11:44:43 ET JOB#: [id number]  cc: Jill AlexandersJustin A. Ether GriffinsFowler, DPM, <Dictator>  Lutie Pickler DPM ELECTRONICALLY SIGNED 11/29/2011 15:10

## 2015-02-20 NOTE — Discharge Summary (Signed)
PATIENT NAME:  Joe [name], Joe [name] MR#:  [id number] DATE OF BIRTH:  1941/04/02  DATE OF ADMISSION:  02/04/2012 DATE OF DISCHARGE:  02/11/2012  FINAL DIAGNOSES: 1. Gangrene of the left foot.  2. Diabetes mellitus. 3. Peripheral arterial disease. 4. History of renal failure with contrast exposure.   PROCEDURE PERFORMED DURING THIS ADMISSION: Left below-knee amputation done by Dr. Festus BarrenJason Dew on 02/06/2012.   COMPLICATIONS: None.  CONSULTATIONS: None.  HOSPITAL COURSE: Patient was admitted. He underwent his procedure on 02/06/2012 without complication. Following surgery he was transferred from the post anesthesia care unit to the surgical floor where vital signs remained stable. He did receive some preoperative and postoperative IV antibiotics and required no transfusions during his admission. His left below-knee amputation site had remained clean, dry and intact. He was deemed stable for discharge on 02/11/2012.   LABORATORY, DIAGNOSTIC AND RADIOLOGICAL DATA: Laboratory data as of 02/11/2012 were hemoglobin 9.7, hematocrit 29.8. Chemistry appears stable.   DISCHARGE MEDICATIONS: None.  DISPOSITION: Patient was discharged home in stable condition on 02/11/2012.        DISCHARGE INSTRUCTIONS:  1. Surgical clips may be removed on his follow up visit three weeks later.  2. Patient has a follow-up appointment with Philomena CourseJason Jc Veron, PA-C for staple removal in three weeks postoperatively and understands to call in the interval if he has any problems or questions.   ____________________________ [name]. Malyia Moro, PA-C jmk:cms D: 02/21/2012 14:26:53 ET T: 02/21/2012 16:47:54 ET JOB#: [id number]  cc: [name]. Marquelle Balow, PA-C, <Dictator> [name] Keishon Chavarin PA ELECTRONICALLY SIGNED 03/07/2012 11:40

## 2015-02-20 NOTE — Consult Note (Signed)
Chief Complaint:   Subjective/Chief Complaint More alert with improved energy .Extubated today but still feels poorly. His breathing is better. Mild cough but better.   VITAL SIGNS/ANCILLARY NOTES: **Vital Signs.:   04-Jan-13 14:47   Vital Signs Type Routine   Temperature Temperature (F) 98.2   Celsius 36.7   Temperature Source oral   Pulse Pulse 69   Pulse source per Dinamap   Respirations Respirations 20   Systolic BP Systolic BP 188   Diastolic BP (mmHg) Diastolic BP (mmHg) 85   Mean BP 111   BP Source Dinamap   Pulse Ox % Pulse Ox % 92   Pulse Ox Activity Level  At rest   Oxygen Delivery 2L  *Intake and Output.:   Daily 04-Jan-13 07:00   Grand Totals Intake:  1320 Output:  1575    Net:  -255 24 Hr.:  -255   Oral Intake      In:  720   IV (Primary)      In:  550   IV (Secondary)      In:  50   Urine ml     Out:  1575   Length of Stay Totals Intake:  7311.6 Output:  7030    Net:  281.6   Brief Assessment:   Cardiac Regular  murmur present  + carotid bruits  + LE edema  clicks    Respiratory normal resp effort  rhonchi  crackles    Gastrointestinal Normal    Gastrointestinal details normal Soft  Nontender  Nondistended  No masses palpable  Bowel sounds normal  No rebound tenderness  No gaurding  No rigidity    Additional Physical Exam UNA booths on both feet   Routine Chem:  04-Jan-13 13:23    Glucose, Serum 338   BUN 62   Creatinine (comp) 3.08   Sodium, Serum 145   Potassium, Serum 3.9   Chloride, Serum 110   CO2, Serum 22   Calcium (Total), Serum 8.4   Osmolality (calc) 320   eGFR (African American) 26   eGFR (Non-African American) 21   Anion Gap 13   Radiology Results: XRay:    29-Dec-12 15:38, Chest PA and Lateral   Chest PA and Lateral    REASON FOR EXAM:    fever, cough  COMMENTS:       PROCEDURE: DXR - DXR CHEST PA (OR AP) AND LATERAL  - Oct 27 2011  3:38PM     RESULT: Comparison: None    Findings:     PA and lateral chest  radiographs are provided.  There is no focal   parenchymal opacity, pleural effusion, or pneumothorax. The heart and   mediastinum are unremarkable. There is evidence of prior median   sternotomy. The osseous structures are unremarkable.    IMPRESSION:   No acute disease of the chest.          Verified By: Jennette Banker, M.D., MD    29-Dec-12 15:43, Foot Left Complete   Foot Left Complete    REASON FOR EXAM:    left foot s/p great toe amputation, with redness,   swelling.  r/o free air  COMMENTS:       PROCEDURE: DXR - DXR FOOT LT COMP W/OBLIQUES  - Oct 27 2011  3:43PM     RESULT: Comparison:  None    Findings:    AP, oblique, and lateral views of the right foot demonstrates no fracture   or dislocation. There is evidence of  prior indication of the distal two   thirds of the first proximal phalanx. There is no soft tissue   abnormality. There is a linear radiopaque foreign body in the plantar   soft tissues between the fourth and fifth metatarsal heads.  IMPRESSION:     No acute osseous injury of the right foot.    There is a linear radiopaque foreign body in the plantar soft tissues   between the fourth and fifth metatarsal heads.    These findings were communicated to Robb Matar on 10/27/2011 at 1730   hours.          Verified By: Jennette Banker, M.D., MD    30-Dec-12 20:10, Chest Portable Single View   Chest Portable Single View    REASON FOR EXAM:    hypoxia  COMMENTS:       PROCEDURE: DXR - DXR PORTABLE CHEST SINGLE VIEW  - Oct 28 2011  8:10PM     RESULT: Comparison: 10/27/2011    Findings:     Single portable AP chest radiograph is provided. There is bilateral   perihilar interstitial thickening with indistinctness of the central   pulmonary vasculature. There is no focal parenchymal opacity, pleural   effusion, or pneumothorax. The heart size is stable.. The osseous   structures are unremarkable.    IMPRESSION:     The overall findings are most  concerning for pulmonary edema.          Verified By: Jennette Banker, M.D., MD    30-Dec-12 22:57, Chest Portable Single View   Chest Portable Single View    REASON FOR EXAM:    central line placement  COMMENTS:       PROCEDURE: DXR - DXR PORTABLE CHEST SINGLE VIEW  - Oct 28 2011 10:57PM     RESULT: Comparison: 10/27/2011     Findings:     Single portable AP chest radiograph is provided. There is an endotracheal   tube in satisfactory position with the tip 4.3 cm above the carina. There   is a left-sided is a venous catheter with the tip projecting over the   SVC. There is bilateral perihilar interstitial thickening with   indistinctness of the central pulmonary vasculature. There is no focal   parenchymal opacity, pleural effusion, or pneumothorax. The heart size is     stable.. The osseous structures are unremarkable.    IMPRESSION:      The overall findings are most concerning for pulmonary edema.          Verified By: Jennette Banker, M.D., MD    01-Jan-13 10:10, Chest Portable Single View   Chest Portable Single View    REASON FOR EXAM:    respiratory failure, recent pulmonary edema.  COMMENTS:       PROCEDURE: DXR - DXR PORTABLE CHEST SINGLE VIEW  - Oct 30 2011 10:10AM     RESULT: Comparison: 10/28/2011    Findings:  The enteric tube is difficult to visualize secondary to underpenetration,   but appears to extend at least below the carina. Remainder of support   apparatus stable. Heart and mediastinum are stable. The lung volumes are   low. There is slight interval decrease in bilateral heterogeneous and   interstitial pulmonary opacities. Retrocardiac opacity is unchanged. This   may represent atelectasis. Infection is not excluded.    IMPRESSION:   1. Slight interval decrease in pulmonary edema.  2. Unchanged retrocardiac opacity.  Verified By: Gregor Hams, M.D., MD  [place]:    31-Dec-12 15:30, US Kidney Bilateral   US Kidney Bilateral     REASON FOR EXAM:    acute renal failure  COMMENTS:       PROCEDURE: [place]  - US KIDNEY  - Oct 29 2011  3:30PM     RESULT: The right kidney measures 10.57 cm x 5.19 cm x 5.86 cm and the   left kidney measures 12.55 cm x 5.61 cm x 5.16 cm. There is a 1.59cm   nonobstructive stone at the lower pole of the right kidney. A few   additional smaller stones are also seen. There is a 1.26 cm cyst of the   right kidney. There is noted prominence of the renal pelvis and renal   infundibula consistent with mild hydronephrosis or ectasia. No renal mass   or hydronephrosis is seen on the left. The urinary bladder is empty and   is not visualized adequately for evaluation on this exam. No ascites is   seen.    IMPRESSION:   1. There is mild prominence of the right renal collecting system   compatible with ectasia or mild hydronephrosis.  2. Multiple nonobstructive right renal stones are seen.  3. There is a 1.26 cm cyst of the right kidney.    Thank you for the opportunity to contribute to the care of your patient.           Verified By: Dionne Ano WALL, M.D., MD  Lab:    30-Dec-12 20:30, ABG   pH (ABG) 7.00   PCO2 92   PO2 63   FiO2 100   Base Excess -10.5   HCO3 22.7   O2 Saturation 76.5   O2 Device nrb   Specimen Site (ABG)    RT RADIAL   Specimen Type (ABG) ARTERIAL   Patient Temp (ABG) 37.0    30-Dec-12 21:55, ABG   pH (ABG) 7.25   PCO2 42   PO2 112   FiO2 100   Base Excess -8.5   HCO3 18.4   O2 Saturation 97.6   Specimen Site (ABG)    RT BRACHIAL   Specimen Type (ABG) ARTERIAL   Patient Temp (ABG) 37.0   Mode    ASSIST CONTROL   Vt 500   PEEP 5.0   Mechanical Rate 12   Result(s) reported on 28 Oct 2011 at 09:59PM.    31-Dec-12 04:40, ABG   pH (ABG) 7.32   PCO2 45   PO2 295   FiO2 100   Base Excess -3.1   HCO3 23.2   O2 Saturation 99.9   O2 Device 840   Specimen Site (ABG)    RT RADIAL   Specimen Type (ABG) ARTERIAL   Patient Temp (ABG) 37.0   Mode     ASSIST CONTROL   Vt 500   PEEP 5.0   Mechanical Rate 14   Result(s) reported on 29 Oct 2011 at 04:49AM.    02-Jan-13 10:50, ABG   pH (ABG) 7.36   PCO2 39   PO2 83   FiO2 30   Base Excess -3.1   HCO3 22.0   O2 Saturation 95.7   O2 Device 840   Specimen Site (ABG)    RT BRACHIAL   Specimen Type (ABG) ARTERIAL   Patient Temp (ABG) 37.0   Mode    SPONTANEOUS   PSV 10   PEEP 5.0   Result(s) reported on 31 Oct 2011 at 10:56AM.  Cardiology:    30-Dec-12 21:58, ECG   Ventricular Rate 122   Atrial Rate 122   QRS Duration 108   QT 352   QTc 501   R Axis 38   T Axis 169   ECG interpretation    Sinus tachycardia  with premature ventricular or aberrantly conducted complexes  ST & T wave abnormality, consider lateral ischemia  Abnormal ECG  When compared with ECG of 26-Oct-2010 23:36,  Vent. rate has increased BY  50 BPM  ST now depressed in Lateral leads  Confirmed by Rockey Situ, TIMOTHY ([id number]) on 10/29/2011 10:23:02 PM    Overreader: Ida Rogue   ECG     31-Dec-12 04:49, ECG   Ventricular Rate 65   Atrial Rate 65   P-R Interval 172   QRS Duration 116   QT 452   QTc 470   P Axis 54   R Axis 29   T Axis 132   ECG interpretation    Normal sinus rhythm  Non-specific intra-ventricular conduction delay  Nonspecific T wave abnormality  Prolonged QT  Abnormal ECG  When compared with ECG of 28-Oct-2011 21:58,  Significant changes have occurred  Confirmed by Rockey Situ, TIMOTHY ([id number]) on 10/29/2011 10:16:56 PM    Overreader: Ida Rogue   ECG     31-Dec-12 14:16, Echo Doppler   Echo Doppler    Interpretation Summary    The left ventricle is mildly dilated. There is no thrombus. Left   ventricular systolic function is moderate to severely reduced.   Ejection Fraction = 25-35%. There is normal left ventricular wall   thickness. There is moderate to severe global hypokinesis of the   left ventricle. The right ventricle is mild to moderately dilated.   The right  ventricular systolic function is moderately reduced. Mild   valvular aortic stenosis. Mild aortic regurgitation. 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 Moderate size left   pleural effusion.    PatientHeight: 170 cm    PatientWeight: 86 kg    BSA: 2.0 m2    Procedure:    A two-dimensional transthoracic echocardiogram with color flow and   Doppler was performed.    The study was completed  in the intensive care unit.    Left Ventricle    Left ventricular systolic function is moderate to severely reduced.    Ejection Fraction = 25-35%.    There is moderate to severe global hypokinesis of the left ventricle.    The left ventricle is mildly dilated.    There is no thrombus.    There is normal left ventricular wall thickness.    Right Ventricle    The right ventricle is mild to moderately dilated.    There is normal right ventricular wall thickness.  The right ventricular systolic function is moderately reduced.    Atria    Borderline left atrial enlargement.    Borderline right atrial enlargement.    Mitral Valve    The mitral valve leaflets appear thickened, but open well.    There is mild mitral regurgitation.    Tricuspid Valve    The tricuspid valve is not well visualized, but is grossly normal.    There is mild to moderate tricuspid regurgitation.    Right ventricular systolic pressure is elevated at 30-107mmHg.    Aortic Valve    Mild valvular aortic stenosis.    Mild aortic regurgitation.    There are no vegetations on this prosthetic aortic valve.  Pulmonic Valve    The pulmonic valve is not well seen, but is grossly normal.    There is no pulmonic valvular regurgitation.    Great Vessels    The aortic root is not well visualized but is probably normal size.    Pericardium/Pleural    Moderate size left pleural effusion.    No pericardial effusion.    MMode 2D Measurements and Calculations    RVDd: 3.2 cm    IVSd: 1.1 cm    LVIDd: 5.1  cm    LVIDs: 4.4 cm    LVPWd: 1.3 cm    FS: 14 %    EF(Teich): 30 %    Ao root diam: 3.6 cm    LA dimension: 3.7 cm    LVOT diam: 2.3 cm    Doppler Measurements and Calculations    MV E point: 109 cm/sec    MV A point: 103 cm/sec    MV E/A: 1.1     MV P1/2t max vel: 109 cm/sec    MV P1/2t: 93 msec    MVA(P1/2t): 2.4 cm2    MV dec slope: 343 cm/sec2    MV dec time: 0.22 sec    Ao V2 max: 193 cm/sec    Ao max PG: 14 mmHg    Ao V2 mean: 111 cm/sec    Ao mean PG: 6.1 mmHg    Ao V2 VTI: 34 cm    AVA(I,D): 1.9 cm2    AVA(V,D): 1.7 cm2    AI max vel: 116 cm/sec    AI max PG: 5.0 mmHg    AI dec slope: 134 cm/sec2    AI P1/2t: 254 msec    LV max PG: 2.0 mmHg    LV mean PG: 1.00 mmHg    LV V1 max: 79 cm/sec    LV V1 mean: 45 cm/sec    LV V1 VTI: 15 cm    MR max vel: 358 cm/sec    MR max PG: 51 mmHg    SV(LVOT): 63 ml    PA V2 max: 107 cm/sec    PA max PG: 5.0 mmHg    PA acc time: 0.05 sec    TR Max vel: 244 cm/sec    TR Max PG: 23 mmHg    RVSP: 33 mmHg    RAP systole: 10 mmHg    PA pr(Accel): 57 mmHg    Reading Physician: Lujean Amel   Sonographer: Jaci Standard  Interpreting Physician:  Lujean Amel,  electronically signed on   10-29-2011 16:59:14  Requesting Physician: Lujean Amel   Assessment/Plan:  Assessment/Plan:   Assessment IMP Cellulitis Bronchitits Acute renal failure CHF NQMI CM Resp Failure HTN AoVR DM PVD Hyperlipidemia  .    Plan PLAN S/P wound vac placement Increase activity Renal fuction stabalized Extubated now and breathing well IV Antibx for cellutits Agree with ECHO Continue Statin Anticoug with heparin IV Lasix IV Wean off vent when able Continue DM control Rec nephrology input F/U Ekgs Repeat troponins PVD as per Podiatry   Electronic Signatures: Lujean Amel D (MD)  (Signed 04-Jan-13 23:10)  Authored: Chief Complaint, VITAL SIGNS/ANCILLARY NOTES, Brief Assessment, Lab Results, Radiology Results,  Assessment/Plan   Last Updated: 04-Jan-13 23:10 by Lujean Amel D (MD)

## 2015-02-20 NOTE — Consult Note (Signed)
PATIENT NAME:  Joe [name], Joe [name] MR#:  [id number] DATE OF BIRTH:  April 26, 1941  DATE OF CONSULTATION:  10/29/2011  REFERRING PHYSICIAN:  Katharina Caper, MD CONSULTING PHYSICIAN:  Ulysse Siemen Lizabeth Leyden, MD  REASON FOR CONSULTATION: Acute renal failure.   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 He had recent toe amputation. Subsequently, it was felt that the left foot may be infected and he was started on doxycycline for left foot infection. He subsequently developed nausea and vomiting. He had multiple episodes of nausea and vomiting while at home and his wife brought him in for evaluation. He was admitted for diabetic foot infection. However, yesterday the patient had decompensation. He developed respiratory failure with a pH of 7, pCO2 92, pO2 of 63. He was subsequently intubated and transferred to the Critical Care Unit. He was placed on Lasix 40 mg IV every 12 hours. His presenting creatinine was 1.1. His creatinine has now increased to 1.98. He also has an elevated troponin of 8.8. Chest x-ray last night showed pulmonary edema.   PAST MEDICAL HISTORY:  1. Hypertension.  2. Longstanding diabetes mellitus.  3. Peripheral neuropathy.  4. History of aortic valve replacement on chronic Coumadin therapy.  5. Coronary artery disease status post four-vessel coronary bypass.  6. 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  7. Amputation of the left great toe 10/19/2011.  8. Peripheral vascular disease.  9. Nephrolithiasis status post lithotripsy.    ALLERGIES: Keflex and oxycodone.     CURRENT MEDICATIONS:  1. Tylenol 325 to 650 mg p.o. q. four hours p.r.n.  2. Amlodipine 5 mg daily.  3. Furosemide 20 mg IV q. 12 hours.  4. Zofran 4 mg IV q. 4 hours p.r.n.  5. Zosyn 3.375 grams IV q. 8 hours.  6. Quinapril 40 mg daily. 7. Zocor 20 mg p.o. at bedtime.  8. Warfarin 5 mg p.o. q. 5 p.m.  9. Aspirin 325 mg daily.  10. Protonix 40 mg IV q. 6 a.m.   SOCIAL HISTORY: The patient [place] [address] with his wife. He is married. He has two children. He is a retired Financial trader. He used to smoke in the past but quit 15 years ago. No reported alcohol or illicit drug use.   FAMILY HISTORY: Significant for diabetes mellitus in multiple family members.   REVIEW OF SYSTEMS: Currently unobtainable as the patient is intubated and sedated.   PHYSICAL EXAMINATION:  VITAL SIGNS: Temperature 97.5, pulse 70, respirations 20, blood pressure 120/69, pulse oximetry 95%.   GENERAL: Critically ill-appearing male seen in the Critical Care Unit.   HEENT: Normocephalic, atraumatic. Extraocular movements were noted spontaneously. Pupils are 3 mm and sluggish to react. Endotracheal tube is in place.   NECK: Supple without JVD or lymphadenopathy.   LUNGS: Lungs demonstrate rales bilaterally and the patient is currently vent -assisted.   HEART: S1, S2. Click of aortic valve is heard. No murmurs or rubs appreciated.   ABDOMEN: Soft, nontender, nondistended. Bowel sounds positive. No rebound, no guarding. No gross organomegaly appreciated.   EXTREMITIES: No clubbing or cyanosis noted. Trace bilateral lower extremity edema noted.  MUSCULOSKELETAL:  The left foot is wrapped. The right foot shows prior history of transmetatarsal amputation.   SKIN: Warm and dry. For the areas of uncovered skin there are no obvious rashes. The left foot, as above, is currently wrapped. Dressing was not taken down.   GU: Foley catheter noted to be in place.    NEUROLOGICAL: The patient is currently intubated and sedated. He does appear to grimace to pain.   PSYCHIATRIC: Unable to assess at this time.   LABS/STUDIES: Sodium 140, potassium 4.5, chloride 103, CO2 25, BUN 32, creatinine 1.98, troponin was 8.8 early this a.m. CBC shows WBC 12.8, hemoglobin 11.9, hematocrit 35, platelets 239, INR 1.9. D-dimer 3.52. Urinalysis is negative for blood, urine protein is 100 mg/dL, 6 RBCs per high-power field, one WBC per high-power field. pH of 7.3, pCO2 45, pO2 295, FiO2 100%.   IMPRESSION: This is a 74 year old Caucasian male with past medical history of diabetes mellitus, hypertension, peripheral neuropathy, aortic valve replacement on chronic Coumadin therapy,  coronary artery disease status post four-vessel coronary artery bypass graft, history of osteomyelitis of the right foot status post transmetatarsal amputation of all toes, amputation of left great second toe 10/19/2011, peripheral vascular disease, and history of nephrolithiasis who presented to [facility] with left foot infection. However, hospital course was complicated by respiratory failure and pulmonary edema.  1. Acute renal failure: I suspect that this is related to his nausea and vomiting that he had earlier as well as respiratory failure and myocardial infarction. The patient is not currently on IV fluid hydration. He is receiving Lasix for pulmonary edema. No acute indication for dialysis at this time. I would recommend holding quinapril at this point in time. We will obtain a renal ultrasound as well as SPEP and UPEP.  2. Proteinuria:  This was noted on urinalysis. We will perform further work-up including urine protein to creatinine ratio, SPEP, UPEP, and ANA.  Could have underlying diabetic nephropathy.  3. Respiratory failure: This was found to be due to pulmonary edema. Agree with use of Lasix for now. Urine output yesterday was found to be 1.2 liters. Continue mechanical  ventilation at this time as well.  4. Diabetic foot infection: The patient is currently maintained on Zosyn for this.  Would continue to follow renal function and dose Zosyn appropriately for his renal function.  5. The patient is currently critically ill and has chance for further decompensation. We will continue to monitor the patient's progress with you.   I would like to thank Dr. Winona LegatoVaickute for this kind referral.    ____________________________ Lennox PippinsMunsoor N. Ozell Ferrera, MD mnl:bjt D: 10/29/2011 10:33:09 ET T: 10/29/2011 10:59:26 ET JOB#: [id number]  cc: Lennox PippinsMunsoor N. Samreen Seltzer, MD, <Dictator> Ria CommentMUNSOOR N Lurine Imel MD ELECTRONICALLY SIGNED 11/20/2011 22:17

## 2015-02-20 NOTE — Op Note (Signed)
PATIENT NAME:  Joe [name], [name] MR#:  [id number] DATE OF BIRTH:  1941/06/07  DATE OF PROCEDURE:  03/12/2012  PREOPERATIVE DIAGNOSES:  1. Wound dehiscence left below-knee amputation site due to fall.  2. Peripheral arterial disease with gangrene, status post left below-knee amputation.  3. Diabetes mellitus.   POSTOPERATIVE DIAGNOSES:  1. Wound dehiscence left below-knee amputation site due to fall.  2. Peripheral arterial disease with gangrene, status post left below-knee amputation.  3. Diabetes mellitus.   PROCEDURE: Irrigation debridement of left below-knee amputation stump with debridement of skin, soft tissue and muscle over approximately 50 sq cm with VAC dressing placement.   SURGEON: Annice NeedyJason S. Dew, MD  ANESTHESIA: General.   ESTIMATED BLOOD LOSS: Minimal.   INDICATION FOR PROCEDURE: 74 year old white male with a below-knee amputation from about one month ago. He fell at home yesterday dehiscing this widely. He is brought to the Operating Room to wash this out and obtain some sort of coverage at this point in an attempt to salvage the below-knee amputation. Risks and benefits are discussed. Informed consent is obtained.   DESCRIPTION OF PROCEDURE: Patient is brought to the operative suite and after an adequate level of general anesthesia was obtained his left below-knee amputation stump was sterilely prepped and draped, sterile surgical field was created. The area was irrigated with saline pulse lavage. It had opened widely over approximately two-thirds the length of the incision and had separated by about 5 to 6 cm. The bone was not directly exposed and I debrided some of the skin, soft tissue and gastrocnemius muscle that did not appear healthy and viable. I then took three interrupted figure-of-eight 0 Vicryl sutures to reapproximate the fascial layer and then took a medium VAC sponge, cut to fit the wound and obtained an occlusive seal with strips of Ioban. The patient was then  awakened from anesthesia, taken to recovery in stable condition having tolerated procedure well.    ____________________________ Annice NeedyJason S. Dew, MD jsd:cms D: 03/12/2012 13:50:40 ET T: 03/12/2012 14:13:10 ET JOB#: [id number]  cc: Annice NeedyJason S. Dew, MD, <Dictator> Annice NeedyJASON S DEW MD ELECTRONICALLY SIGNED 03/12/2012 15:12

## 2015-02-20 NOTE — Consult Note (Signed)
Brief Urology Consultation Report for Consultation: Progressive ARI with Right Nephrolithiasis and ?Hydronephrosis Physician: Mady HaagensenMunsoor Lateef, M.D. (Nephrology)Care Physician: Alonna BucklerAndrew Lamb, M.D.Urologist: Assunta GamblesBrian Cope, M.D. Physician: Marin OlpJay H. Philander Ake, M.D.  Progressive ARI    -most likely due to ATN from renal hypoperfusion secondary to Acute MI with decreased EF/respiratory failure/Pulmonary Edema Right Nephrolithiasis with ? Mild Hydro on RUS 10/29/2011    -the pt has a h/o recurrent urolithiasis requiring ESWL and Percutaneous Extraction in the past under the care of Dr. Assunta GamblesBrian Cope.    -the pt had a Stone CT 09/17/2011 to evaluate right inguinal pain with gross hematuria. This revealed small stones in the RLP (up to 4.387mm) without ureterolithiasis/hydro.     -the pt's right inguinal pain/hematuria resolved after the CT, per the pt's wife and the pt denies any current flank/abd/inguinal pain    -do not suspect that unilateral ureteral obstruction is present or potentially contributing to the progressive ARI - no acute intervention indicated  Maximize renal perfusion as permitted clinically.Consider re-imaging if the ARI does not plateau/improve - would prefer Stone CT to better evaluate for possible ureteral obstruction, if possible, rather than RUS.Dr. Anola GurneyMichael Wolff will be covering for Urology beginning at 5am, 10/31/2011. you for the opportunity to participate in the care of your patient.  Electronic Signatures: Marin OlpKim, Janayla Marik H (MD)  (Signed on 01-Jan-13 12:50)  Authored  Last Updated: 01-Jan-13 12:50 by Marin OlpKim, Molley Houser H (MD)

## 2015-02-20 NOTE — Consult Note (Signed)
Chief Complaint:   Subjective/Chief Complaint Extubated today but still feels poorly. His breathing is better. Mild cough.   VITAL SIGNS/ANCILLARY NOTES: **Vital Signs.:   02-Jan-13 13:00   Pulse Pulse 86   Respirations Respirations 22   Systolic BP Systolic BP 139   Diastolic BP (mmHg) Diastolic BP (mmHg) 77   Mean BP 97   Pulse Ox % Pulse Ox % 96   Pulse Ox Activity Level  At rest   Oxygen Delivery 4L; Nasal Cannula   Pulse Ox Heart Rate 86  *Intake and Output.:   02-Jan-13 14:00   Grand Totals Intake:  24.5 Output:  450    Net:  -425.5 24 Hr.:  -210.5   Enteral feeding ml     In:  0   Heparin      In:  14.5   Enteral Feeding flush intake      In:  0   IV (Primary)      In:  10   Urine ml     Out:  450   Urinary Method  Foley   Brief Assessment:   Cardiac Regular  murmur present  + carotid bruits  + LE edema  clicks    Respiratory normal resp effort  rhonchi  crackles    Gastrointestinal Normal    Gastrointestinal details normal Soft  Nontender  Nondistended  No masses palpable  Bowel sounds normal  No rebound tenderness  No gaurding  No rigidity   Routine Hem:  02-Jan-13 04:45    WBC (CBC) 7.2   RBC (CBC) 3.70   Hemoglobin (CBC) 10.6   Hematocrit (CBC) 32.0   Platelet Count (CBC) 266   MCV 87   MCH 28.6   MCHC 33.1   RDW 15.1  Routine Chem:  02-Jan-13 04:45    Glucose, Serum 192   BUN 57   Creatinine (comp) 3.37   Sodium, Serum 141   Potassium, Serum 3.9   Chloride, Serum 103   CO2, Serum 24   Calcium (Total), Serum 8.6   Osmolality (calc) 302   eGFR (African American) 23   eGFR (Non-African American) 19   Anion Gap 14  Routine Coag:  02-Jan-13 04:45    Prothrombin 29.4   INR 2.9   Activated PTT (APTT) 97.9  Routine Hem:  02-Jan-13 04:45    Bands 2   Segmented Neutrophils 81   Lymphocytes 12   Monocytes 5  Routine Chem:  02-Jan-13 04:45    Magnesium, Serum 2.3   Phosphorus, Serum 6.3  Lab:  02-Jan-13 10:50    pH (ABG) 7.36   PCO2  39   PO2 83   FiO2 30   Base Excess -3.1   HCO3 22.0   O2 Saturation 95.7   O2 Device 840   Specimen Type (ABG) ARTERIAL   Patient Temp (ABG) 37.0   PEEP 5.0   PSV 10   Radiology Results: XRay:    29-Dec-12 15:38, Chest PA and Lateral   Chest PA and Lateral    REASON FOR EXAM:    fever, cough  COMMENTS:       PROCEDURE: DXR - DXR CHEST PA (OR AP) AND LATERAL  - Oct 27 2011  3:38PM     RESULT: Comparison: None    Findings:     PA and lateral chest radiographs are provided.  There is no focal   parenchymal opacity, pleural effusion, or pneumothorax. The heart and   mediastinum are unremarkable. There is evidence of prior   median   sternotomy. The osseous structures are unremarkable.    IMPRESSION:   No acute disease of the chest.          Verified By: HETAL P. PATEL, M.D., MD    29-Dec-12 15:43, Foot Left Complete   Foot Left Complete    REASON FOR EXAM:    left foot s/p great toe amputation, with redness,   swelling.  r/o free air  COMMENTS:       PROCEDURE: DXR - DXR FOOT LT COMP W/OBLIQUES  - Oct 27 2011  3:43PM     RESULT: Comparison:  None    Findings:    AP, oblique, and lateral views of the right foot demonstrates no fracture   or dislocation. There is evidence of prior indication of the distal two   thirds of the first proximal phalanx. There is no soft tissue   abnormality. There is a linear radiopaque foreign body in the plantar   soft tissues between the fourth and fifth metatarsal heads.  IMPRESSION:     No acute osseous injury of the right foot.    There is a linear radiopaque foreign body in the plantar soft tissues   between the fourth and fifth metatarsal heads.    These findings were communicated to Robb Wiegand on 10/27/2011 at 1730   hours.          Verified By: HETAL P. PATEL, M.D., MD    30-Dec-12 20:10, Chest Portable Single View   Chest Portable Single View    REASON FOR EXAM:    hypoxia  COMMENTS:       PROCEDURE: DXR - DXR  PORTABLE CHEST SINGLE VIEW  - Oct 28 2011  8:10PM     RESULT: Comparison: 10/27/2011    Findings:     Single portable AP chest radiograph is provided. There is bilateral   perihilar interstitial thickening with indistinctness of the central   pulmonary vasculature. There is no focal parenchymal opacity, pleural   effusion, or pneumothorax. The heart size is stable.. The osseous   structures are unremarkable.    IMPRESSION:     The overall findings are most concerning for pulmonary edema.          Verified By: HETAL P. PATEL, M.D., MD    30-Dec-12 22:57, Chest Portable Single View   Chest Portable Single View    REASON FOR EXAM:    central line placement  COMMENTS:       PROCEDURE: DXR - DXR PORTABLE CHEST SINGLE VIEW  - Oct 28 2011 10:57PM     RESULT: Comparison: 10/27/2011     Findings:     Single portable AP chest radiograph is provided. There is an endotracheal   tube in satisfactory position with the tip 4.3 cm above the carina. There   is a left-sided is a venous catheter with the tip projecting over the   SVC. There is bilateral perihilar interstitial thickening with   indistinctness of the central pulmonary vasculature. There is no focal   parenchymal opacity, pleural effusion, or pneumothorax. The heart size is     stable.. The osseous structures are unremarkable.    IMPRESSION:      The overall findings are most concerning for pulmonary edema.          Verified By: HETAL P. PATEL, M.D., MD    01-Jan-13 10:10, Chest Portable Single View   Chest Portable Single View    REASON FOR EXAM:    respiratory   failure, recent pulmonary edema.  COMMENTS:       PROCEDURE: DXR - DXR PORTABLE CHEST SINGLE VIEW  - Oct 30 2011 10:10AM     RESULT: Comparison: 10/28/2011    Findings:  The enteric tube is difficult to visualize secondary to underpenetration,   but appears to extend at least below the carina. Remainder of support   apparatus stable. Heart and  mediastinum are stable. The lung volumes are   low. There is slight interval decrease in bilateral heterogeneous and   interstitial pulmonary opacities. Retrocardiac opacity is unchanged. This   may represent atelectasis. Infection is not excluded.    IMPRESSION:   1. Slight interval decrease in pulmonary edema.  2. Unchanged retrocardiac opacity.          Verified By: ROBERT L. SUBER, M.D., MD  US:    31-Dec-12 15:30, US Kidney Bilateral   US Kidney Bilateral    REASON FOR EXAM:    acute renal failure  COMMENTS:       PROCEDURE: US  - US KIDNEY  - Oct 29 2011  3:30PM     RESULT: The right kidney measures 10.57 cm x 5.19 cm x 5.86 cm and the   left kidney measures 12.55 cm x 5.61 cm x 5.16 cm. There is a 1.59cm   nonobstructive stone at the lower pole of the right kidney. A few   additional smaller stones are also seen. There is a 1.26 cm cyst of the   right kidney. There is noted prominence of the renal pelvis and renal   infundibula consistent with mild hydronephrosis or ectasia. No renal mass   or hydronephrosis is seen on the left. The urinary bladder is empty and   is not visualized adequately for evaluation on this exam. No ascites is   seen.    IMPRESSION:   1. There is mild prominence of the right renal collecting system   compatible with ectasia or mild hydronephrosis.  2. Multiple nonobstructive right renal stones are seen.  3. There is a 1.26 cm cyst of the right kidney.    Thank you for the opportunity to contribute to the care of your patient.           Verified By: JACK G. WALL, M.D., MD  Lab:    30-Dec-12 20:30, ABG   pH (ABG) 7.00   PCO2 92   PO2 63   FiO2 100   Base Excess -10.5   HCO3 22.7   O2 Saturation 76.5   O2 Device nrb   Specimen Site (ABG)    RT RADIAL   Specimen Type (ABG) ARTERIAL   Patient Temp (ABG) 37.0    30-Dec-12 21:55, ABG   pH (ABG) 7.25   PCO2 42   PO2 112   FiO2 100   Base Excess -8.5   HCO3 18.4   O2 Saturation  97.6   Specimen Site (ABG)    RT BRACHIAL   Specimen Type (ABG) ARTERIAL   Patient Temp (ABG) 37.0   Mode    ASSIST CONTROL   Vt 500   PEEP 5.0   Mechanical Rate 12   Result(s) reported on 28 Oct 2011 at 09:59PM.    31-Dec-12 04:40, ABG   pH (ABG) 7.32   PCO2 45   PO2 295   FiO2 100   Base Excess -3.1   HCO3 23.2   O2 Saturation 99.9   O2 Device 840   Specimen Site (ABG)      RT RADIAL   Specimen Type (ABG) ARTERIAL   Patient Temp (ABG) 37.0   Mode    ASSIST CONTROL   Vt 500   PEEP 5.0   Mechanical Rate 14   Result(s) reported on 29 Oct 2011 at 04:49AM.    02-Jan-13 10:50, ABG   pH (ABG) 7.36   PCO2 39   PO2 83   FiO2 30   Base Excess -3.1   HCO3 22.0   O2 Saturation 95.7   O2 Device 840   Specimen Site (ABG)    RT BRACHIAL   Specimen Type (ABG) ARTERIAL   Patient Temp (ABG) 37.0   Mode    SPONTANEOUS   PSV 10   PEEP 5.0   Result(s) reported on 31 Oct 2011 at 10:56AM.  Cardiology:    30-Dec-12 21:58, ECG   Ventricular Rate 122   Atrial Rate 122   QRS Duration 108   QT 352   QTc 501   R Axis 38   T Axis 169   ECG interpretation    Sinus tachycardia  with premature ventricular or aberrantly conducted complexes  ST & T wave abnormality, consider lateral ischemia  Abnormal ECG  When compared with ECG of 26-Oct-2010 23:36,  Vent. rate has increased BY  50 BPM  ST now depressed in Lateral leads  Confirmed by GOLLAN, TIMOTHY ([id number]) on 10/29/2011 10:23:02 PM    Overreader: GOLLAN, TIMOTHY   ECG     31-Dec-12 04:49, ECG   Ventricular Rate 65   Atrial Rate 65   P-R Interval 172   QRS Duration 116   QT 452   QTc 470   P Axis 54   R Axis 29   T Axis 132   ECG interpretation    Normal sinus rhythm  Non-specific intra-ventricular conduction delay  Nonspecific T wave abnormality  Prolonged QT  Abnormal ECG  When compared with ECG of 28-Oct-2011 21:58,  Significant changes have occurred  Confirmed by GOLLAN, TIMOTHY ([id number]) on 10/29/2011 10:16:56  PM    Overreader: GOLLAN, TIMOTHY   ECG     31-Dec-12 14:16, Echo Doppler   Echo Doppler    Interpretation Summary    The left ventricle is mildly dilated. There is no thrombus. Left   ventricular systolic function is moderate to severely reduced.   Ejection Fraction = 25-35%. There is normal left ventricular wall   thickness. There is moderate to severe global hypokinesis of the   left ventricle. The right ventricle is mild to moderately dilated.   The right ventricular systolic function is moderately reduced. Mild   valvular aortic stenosis. Mild aortic regurgitation. 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 Moderate size left   pleural effusion.    PatientHeight: 170 cm    PatientWeight: 86 kg    BSA: 2.0 m2    Procedure:    A two-dimensional transthoracic echocardiogram with color flow and   Doppler was performed.    The study was completed  in the intensive care unit.    Left Ventricle    Left ventricular systolic function is moderate to severely reduced.    Ejection Fraction = 25-35%.    There is moderate to severe global hypokinesis of the left ventricle.    The left ventricle is mildly dilated.    There is no thrombus.    There is normal left ventricular wall thickness.    Right Ventricle    The right ventricle is mild to moderately dilated.      There is normal right ventricular wall thickness.  The right ventricular systolic function is moderately reduced.    Atria    Borderline left atrial enlargement.    Borderline right atrial enlargement.    Mitral Valve    The mitral valve leaflets appear thickened, but open well.    There is mild mitral regurgitation.    Tricuspid Valve    The tricuspid valve is not well visualized, but is grossly normal.    There is mild to moderate tricuspid regurgitation.    Right ventricular systolic pressure is elevated at 30-58mHg.    Aortic Valve    Mild valvular aortic stenosis.    Mild aortic  regurgitation.    There are no vegetations on this prosthetic aortic valve.    Pulmonic Valve    The pulmonic valve is not well seen, but is grossly normal.    There is no pulmonic valvular regurgitation.    Great Vessels    The aortic root is not well visualized but is probably normal size.    Pericardium/Pleural    Moderate size left pleural effusion.    No pericardial effusion.    MMode 2D Measurements and Calculations    RVDd: 3.2 cm    IVSd: 1.1 cm    LVIDd: 5.1 cm    LVIDs: 4.4 cm    LVPWd: 1.3 cm    FS: 14 %    EF(Teich): 30 %    Ao root diam: 3.6 cm    LA dimension: 3.7 cm    LVOT diam: 2.3 cm    Doppler Measurements and Calculations    MV E point: 109 cm/sec    MV A point: 103 cm/sec    MV E/A: 1.1     MV P1/2t max vel: 109 cm/sec    MV P1/2t: 93 msec    MVA(P1/2t): 2.4 cm2    MV dec slope: 343 cm/sec2    MV dec time: 0.22 sec    Ao V2 max: 193 cm/sec    Ao max PG: 14 mmHg    Ao V2 mean: 111 cm/sec    Ao mean PG: 6.1 mmHg    Ao V2 VTI: 34 cm    AVA(I,D): 1.9 cm2    AVA(V,D): 1.7 cm2    AI max vel: 116 cm/sec    AI max PG: 5.0 mmHg    AI dec slope: 134 cm/sec2    AI P1/2t: 254 msec    LV max PG: 2.0 mmHg    LV mean PG: 1.00 mmHg    LV V1 max: 79 cm/sec    LV V1 mean: 45 cm/sec    LV V1 VTI: 15 cm    MR max vel: 358 cm/sec    MR max PG: 51 mmHg    SV(LVOT): 63 ml    PA V2 max: 107 cm/sec    PA max PG: 5.0 mmHg    PA acc time: 0.05 sec    TR Max vel: 244 cm/sec    TR Max PG: 23 mmHg    RVSP: 33 mmHg    RAP systole: 10 mmHg    PA pr(Accel): 57 mmHg    Reading Physician: CLujean Amel  Sonographer: SJaci Standard Interpreting Physician:  DLujean Amel  electronically signed on   10-29-2011 16:59:14  Requesting Physician: CLujean Amel  Assessment/Plan:  Invasive Device Daily Assessment of Necessity:   Does the patient currently have any of the following indwelling devices? foley  central line catheter  Indwelling Urinary  Catheter continued, requirement due to    Reason to continue Indwelling Urinary Catheter immobilization due to sedation/paralysis    Central Line continued, requirement due to    Reason to continue [employer] for frequent venous blood sampling   Assessment/Plan:   Assessment IMP Bronchitits Acute renal failure CHF NQMI CM Resp Failure HTN AoVR DM PVD Hyperlipidemia  .    Plan PLAN Extubated now and breathing well IV Antibx for cellutits Agree with ECHO Continue Statin Anticoug with heparin IV Lasix IV Wean off vent when able Continue DM control Rec nephrology input F/U Ekgs Repeat troponins PVD as per Podiatry   Electronic Signatures: Lujean Amel D (MD)  (Signed 02-Jan-13 22:06)  Authored: Chief Complaint, VITAL SIGNS/ANCILLARY NOTES, Brief Assessment, Lab Results, Radiology Results, Assessment/Plan   Last Updated: 02-Jan-13 22:06 by Lujean Amel D (MD)

## 2015-02-20 NOTE — Consult Note (Signed)
Chief Complaint:   Subjective/Chief Complaint S/P extubated yesterday and now transfered to tele. No cp still sob   VITAL SIGNS/ANCILLARY NOTES: **Vital Signs.:   03-Jan-13 14:16   Vital Signs Type Routine   Temperature Temperature (F) 97.9   Celsius 36.6   Temperature Source oral   Pulse Pulse 77   Pulse source per Dinamap   Respirations Respirations 20   Systolic BP Systolic BP 563   Diastolic BP (mmHg) Diastolic BP (mmHg) 77   Mean BP 108   BP Source Dinamap   Pulse Ox % Pulse Ox % 96   Pulse Ox Activity Level  At rest   Oxygen Delivery 4L  *Intake and Output.:   03-Jan-13 11:00   Grand Totals Intake:  75 Output:  475    Net:  -400 24 Hr.:  -400   IV (Primary)      In:  75   Urine ml     Out:  475   Urinary Method  Foley   Brief Assessment:   Cardiac Regular  murmur present  + carotid bruits  + LE edema  clicks    Respiratory normal resp effort  rhonchi  crackles    Gastrointestinal Normal    Gastrointestinal details normal Soft  Nontender  Nondistended  No masses palpable  Bowel sounds normal  No rebound tenderness  No gaurding  No rigidity   Routine Hem:  03-Jan-13 04:45    WBC (CBC) 7.5   RBC (CBC) 3.56   Hemoglobin (CBC) 10.2   Hematocrit (CBC) 30.8   Platelet Count (CBC) 298   MCV 87   MCH 28.7   MCHC 33.1   RDW 14.7   Neutrophil % 79.2   Lymphocyte % 12.3   Monocyte % 7.4   Eosinophil % 0.8   Basophil % 0.3   Neutrophil # 5.9   Lymphocyte # 0.9   Monocyte # 0.6   Eosinophil # 0.1   Basophil # 0.0  Routine Chem:  03-Jan-13 04:45    Glucose, Serum 179   BUN 64   Creatinine (comp) 3.59   Sodium, Serum 147   Potassium, Serum 3.8   Chloride, Serum 108   CO2, Serum 24   Calcium (Total), Serum 8.8   Osmolality (calc) 315   eGFR (African American) 22   eGFR (Non-African American) 18   Anion Gap 15  Routine Coag:  03-Jan-13 04:45    Prothrombin 26.5   INR 2.5   Activated PTT (APTT) 145.4   Radiology Results: XRay:    29-Dec-12  15:38, Chest PA and Lateral   Chest PA and Lateral    REASON FOR EXAM:    fever, cough  COMMENTS:       PROCEDURE: DXR - DXR CHEST PA (OR AP) AND LATERAL  - Oct 27 2011  3:38PM     RESULT: Comparison: None    Findings:     PA and lateral chest radiographs are provided.  There is no focal   parenchymal opacity, pleural effusion, or pneumothorax. The heart and   mediastinum are unremarkable. There is evidence of prior median   sternotomy. The osseous structures are unremarkable.    IMPRESSION:   No acute disease of the chest.          Verified By: Jennette Banker, M.D., MD    29-Dec-12 15:43, Foot Left Complete   Foot Left Complete    REASON FOR EXAM:    left foot s/p great toe amputation, with redness,  swelling.  r/o free air  COMMENTS:       PROCEDURE: DXR - DXR FOOT LT COMP W/OBLIQUES  - Oct 27 2011  3:43PM     RESULT: Comparison:  None    Findings:    AP, oblique, and lateral views of the right foot demonstrates no fracture   or dislocation. There is evidence of prior indication of the distal two   thirds of the first proximal phalanx. There is no soft tissue   abnormality. There is a linear radiopaque foreign body in the plantar   soft tissues between the fourth and fifth metatarsal heads.  IMPRESSION:     No acute osseous injury of the right foot.    There is a linear radiopaque foreign body in the plantar soft tissues   between the fourth and fifth metatarsal heads.    These findings were communicated to Robb Matar on 10/27/2011 at 1730   hours.          Verified By: Jennette Banker, M.D., MD    30-Dec-12 20:10, Chest Portable Single View   Chest Portable Single View    REASON FOR EXAM:    hypoxia  COMMENTS:       PROCEDURE: DXR - DXR PORTABLE CHEST SINGLE VIEW  - Oct 28 2011  8:10PM     RESULT: Comparison: 10/27/2011    Findings:     Single portable AP chest radiograph is provided. There is bilateral   perihilar interstitial thickening with  indistinctness of the central   pulmonary vasculature. There is no focal parenchymal opacity, pleural   effusion, or pneumothorax. The heart size is stable.. The osseous   structures are unremarkable.    IMPRESSION:     The overall findings are most concerning for pulmonary edema.          Verified By: Jennette Banker, M.D., MD    30-Dec-12 22:57, Chest Portable Single View   Chest Portable Single View    REASON FOR EXAM:    central line placement  COMMENTS:       PROCEDURE: DXR - DXR PORTABLE CHEST SINGLE VIEW  - Oct 28 2011 10:57PM     RESULT: Comparison: 10/27/2011     Findings:     Single portable AP chest radiograph is provided. There is an endotracheal   tube in satisfactory position with the tip 4.3 cm above the carina. There   is a left-sided is a venous catheter with the tip projecting over the   SVC. There is bilateral perihilar interstitial thickening with   indistinctness of the central pulmonary vasculature. There is no focal   parenchymal opacity, pleural effusion, or pneumothorax. The heart size is     stable.. The osseous structures are unremarkable.    IMPRESSION:      The overall findings are most concerning for pulmonary edema.          Verified By: Jennette Banker, M.D., MD    01-Jan-13 10:10, Chest Portable Single View   Chest Portable Single View    REASON FOR EXAM:    respiratory failure, recent pulmonary edema.  COMMENTS:       PROCEDURE: DXR - DXR PORTABLE CHEST SINGLE VIEW  - Oct 30 2011 10:10AM     RESULT: Comparison: 10/28/2011    Findings:  The enteric tube is difficult to visualize secondary to underpenetration,   but appears to extend at least below the carina. Remainder of support   apparatus stable. Heart and  mediastinum are stable. The lung volumes are   low. There is slight interval decrease in bilateral heterogeneous and   interstitial pulmonary opacities. Retrocardiac opacity is unchanged. This   may represent atelectasis.  Infection is not excluded.    IMPRESSION:   1. Slight interval decrease in pulmonary edema.  2. Unchanged retrocardiac opacity.          Verified By: Lewie Chamber, M.D., MD  [place]:    31-Dec-12 15:30, US Kidney Bilateral   US Kidney Bilateral    REASON FOR EXAM:    acute renal failure  COMMENTS:       PROCEDURE: [place]  - US KIDNEY  - Oct 29 2011  3:30PM     RESULT: The right kidney measures 10.57 cm x 5.19 cm x 5.86 cm and the   left kidney measures 12.55 cm x 5.61 cm x 5.16 cm. There is a 1.59cm   nonobstructive stone at the lower pole of the right kidney. A few   additional smaller stones are also seen. There is a 1.26 cm cyst of the   right kidney. There is noted prominence of the renal pelvis and renal   infundibula consistent with mild hydronephrosis or ectasia. No renal mass   or hydronephrosis is seen on the left. The urinary bladder is empty and   is not visualized adequately for evaluation on this exam. No ascites is   seen.    IMPRESSION:   1. There is mild prominence of the right renal collecting system   compatible with ectasia or mild hydronephrosis.  2. Multiple nonobstructive right renal stones are seen.  3. There is a 1.26 cm cyst of the right kidney.    Thank you for the opportunity to contribute to the care of your patient.           Verified By: Raelyn Number WALL, M.D., MD  Lab:    30-Dec-12 20:30, ABG   pH (ABG) 7.00   PCO2 92   PO2 63   FiO2 100   Base Excess -10.5   HCO3 22.7   O2 Saturation 76.5   O2 Device nrb   Specimen Site (ABG)    RT RADIAL   Specimen Type (ABG) ARTERIAL   Patient Temp (ABG) 37.0    30-Dec-12 21:55, ABG   pH (ABG) 7.25   PCO2 42   PO2 112   FiO2 100   Base Excess -8.5   HCO3 18.4   O2 Saturation 97.6   Specimen Site (ABG)    RT BRACHIAL   Specimen Type (ABG) ARTERIAL   Patient Temp (ABG) 37.0   Mode    ASSIST CONTROL   Vt 500   PEEP 5.0   Mechanical Rate 12   Result(s) reported on 28 Oct 2011 at 09:59PM.     31-Dec-12 04:40, ABG   pH (ABG) 7.32   PCO2 45   PO2 295   FiO2 100   Base Excess -3.1   HCO3 23.2   O2 Saturation 99.9   O2 Device 840   Specimen Site (ABG)    RT RADIAL   Specimen Type (ABG) ARTERIAL   Patient Temp (ABG) 37.0   Mode    ASSIST CONTROL   Vt 500   PEEP 5.0   Mechanical Rate 14   Result(s) reported on 29 Oct 2011 at 04:49AM.    02-Jan-13 10:50, ABG   pH (ABG) 7.36   PCO2 39   PO2 83   FiO2 30  Base Excess -3.1   HCO3 22.0   O2 Saturation 95.7   O2 Device 840   Specimen Site (ABG)    RT BRACHIAL   Specimen Type (ABG) ARTERIAL   Patient Temp (ABG) 37.0   Mode    SPONTANEOUS   PSV 10   PEEP 5.0   Result(s) reported on 31 Oct 2011 at 10:56AM.  Cardiology:    30-Dec-12 21:58, ECG   Ventricular Rate 122   Atrial Rate 122   QRS Duration 108   QT 352   QTc 501   R Axis 38   T Axis 169   ECG interpretation    Sinus tachycardia  with premature ventricular or aberrantly conducted complexes  ST & T wave abnormality, consider lateral ischemia  Abnormal ECG  When compared with ECG of 26-Oct-2010 23:36,  Vent. rate has increased BY  50 BPM  ST now depressed in Lateral leads  Confirmed by Rockey Situ, TIMOTHY ([id number]) on 10/29/2011 10:23:02 PM    Overreader: Ida Rogue   ECG     31-Dec-12 04:49, ECG   Ventricular Rate 65   Atrial Rate 65   P-R Interval 172   QRS Duration 116   QT 452   QTc 470   P Axis 54   R Axis 29   T Axis 132   ECG interpretation    Normal sinus rhythm  Non-specific intra-ventricular conduction delay  Nonspecific T wave abnormality  Prolonged QT  Abnormal ECG  When compared with ECG of 28-Oct-2011 21:58,  Significant changes have occurred  Confirmed by Rockey Situ, TIMOTHY ([id number]) on 10/29/2011 10:16:56 PM    Overreader: Ida Rogue   ECG     31-Dec-12 14:16, Echo Doppler   Echo Doppler    Interpretation Summary    The left ventricle is mildly dilated. There is no thrombus. Left   ventricular systolic function is  moderate to severely reduced.   Ejection Fraction = 25-35%. There is normal left ventricular wall   thickness. There is moderate to severe global hypokinesis of the   left ventricle. The right ventricle is mild to moderately dilated.   The right ventricular systolic function is moderately reduced. Mild   valvular aortic stenosis. Mild aortic regurgitation. 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 Moderate size left   pleural effusion.    PatientHeight: 170 cm    PatientWeight: 86 kg    BSA: 2.0 m2    Procedure:    A two-dimensional transthoracic echocardiogram with color flow and   Doppler was performed.    The study was completed  in the intensive care unit.    Left Ventricle    Left ventricular systolic function is moderate to severely reduced.    Ejection Fraction = 25-35%.    There is moderate to severe global hypokinesis of the left ventricle.    The left ventricle is mildly dilated.    There is no thrombus.    There is normal left ventricular wall thickness.    Right Ventricle    The right ventricle is mild to moderately dilated.    There is normal right ventricular wall thickness.  The right ventricular systolic function is moderately reduced.    Atria    Borderline left atrial enlargement.    Borderline right atrial enlargement.    Mitral Valve    The mitral valve leaflets appear thickened, but open well.    There is mild mitral regurgitation.    Tricuspid Valve  The tricuspid valve is not well visualized, but is grossly normal.    There is mild to moderate tricuspid regurgitation.    Right ventricular systolic pressure is elevated at 30-74mmHg.    Aortic Valve    Mild valvular aortic stenosis.    Mild aortic regurgitation.    There are no vegetations on this prosthetic aortic valve.    Pulmonic Valve    The pulmonic valve is not well seen, but is grossly normal.    There is no pulmonic valvular regurgitation.    Great Vessels    The  aortic root is not well visualized but is probably normal size.    Pericardium/Pleural    Moderate size left pleural effusion.    No pericardial effusion.    MMode 2D Measurements and Calculations    RVDd: 3.2 cm    IVSd: 1.1 cm    LVIDd: 5.1 cm    LVIDs: 4.4 cm    LVPWd: 1.3 cm    FS: 14 %    EF(Teich): 30 %    Ao root diam: 3.6 cm    LA dimension: 3.7 cm    LVOT diam: 2.3 cm    Doppler Measurements and Calculations    MV E point: 109 cm/sec    MV A point: 103 cm/sec    MV E/A: 1.1     MV P1/2t max vel: 109 cm/sec    MV P1/2t: 93 msec    MVA(P1/2t): 2.4 cm2    MV dec slope: 343 cm/sec2    MV dec time: 0.22 sec    Ao V2 max: 193 cm/sec    Ao max PG: 14 mmHg    Ao V2 mean: 111 cm/sec    Ao mean PG: 6.1 mmHg    Ao V2 VTI: 34 cm    AVA(I,D): 1.9 cm2    AVA(V,D): 1.7 cm2    AI max vel: 116 cm/sec    AI max PG: 5.0 mmHg    AI dec slope: 134 cm/sec2    AI P1/2t: 254 msec    LV max PG: 2.0 mmHg    LV mean PG: 1.00 mmHg    LV V1 max: 79 cm/sec    LV V1 mean: 45 cm/sec    LV V1 VTI: 15 cm    MR max vel: 358 cm/sec    MR max PG: 51 mmHg    SV(LVOT): 63 ml    PA V2 max: 107 cm/sec    PA max PG: 5.0 mmHg    PA acc time: 0.05 sec    TR Max vel: 244 cm/sec    TR Max PG: 23 mmHg    RVSP: 33 mmHg    RAP systole: 10 mmHg    PA pr(Accel): 57 mmHg    Reading Physician: Lujean Amel   Sonographer: Jaci Standard  Interpreting Physician:  Lujean Amel,  electronically signed on   10-29-2011 16:59:14  Requesting Physician: Lujean Amel   Assessment/Plan:  Invasive Device Daily Assessment of Necessity:   Does the patient currently have any of the following indwelling devices? foley  central line catheter    Indwelling Urinary Catheter continued, requirement due to    Reason to continue Indwelling Urinary Catheter immobilization due to [place] continued, requirement due to    Reason to continue [employer] for frequent  venous blood sampling   Assessment/Plan:   Assessment IMP COPD Bronchitits Acute renal failure CHF NQMI CM Resp Failure HTN AoVR DM  PVD Hyperlipidemia  .    Plan PLAN Consider d/c foley Podiatry to place wound vac Extubated now and breathing well IV Antibx for cellutits Agree with ECHO Continue Statin Anticoug with heparin IV Lasix IV Wean off vent when able Continue DM control Rec nephrology input F/U Ekgs Repeat troponins PVD as per Podiatry   Electronic Signatures: Lujean Amel D (MD)  (Signed 04-Jan-13 23:15)  Authored: Chief Complaint, VITAL SIGNS/ANCILLARY NOTES, Brief Assessment, Lab Results, Radiology Results, Assessment/Plan   Last Updated: 04-Jan-13 23:15 by Lujean Amel D (MD)

## 2015-02-20 NOTE — Consult Note (Signed)
PATIENT NAME:  Joe [name], Joe [name] MR#:  [id number] DATE OF BIRTH:  Feb 20, 1941  DATE OF CONSULTATION:  10/30/2011  REFERRING PHYSICIAN:  Mady Haagensen, MD   PRIMARY CARE PHYSICIAN:  Alonna Buckler, MD  CONSULTING PHYSICIAN:  Marin Olp, MD  REASON FOR CONSULTATION:  Right nephrolithiasis with question of hydronephrosis with acute renal insufficiency.  HISTORY OF PRESENT ILLNESS: The patient is a 74 year old white male with a long-standing history of adult onset diabetes mellitus, requiring insulin, associated with neuropathy and peripheral vascular disease, as well as atherosclerotic cardiovascular disease. The patient presented to the [facility] Emergency Department on 10/27/2011 for the evaluation of nausea and vomiting. The patient was status post left great toe amputation on 10/19/2011 by Dr. Ether Griffins and had been placed on doxycycline for a possible wound infection by Dr. Ether Griffins on 10/25/2011. On 10/27/2011, the patient developed recurrent nausea and vomiting which was described as primarily pink phlegm per the patient's wife. The patient is currently intubated in the Critical Care Unit and so the vast majority of the patient's history is from the patient's wife and review of the medical record. The patient was not experiencing any chest pain or shortness of breath or flank or abdominal pain at presentation to the Emergency Department. In the Emergency Department, the patient was found to be febrile with a temperature of 101.9 degrees Fahrenheit with stable vital signs with a blood pressure of 142/75, pulse 96, respiratory rate 20, with a 95% room air oxygen saturation. The patient was also found to have a leukocytosis with a white blood cell count of 12.9 thousand. Serum creatinine was at his baseline at 1.19. Serum glucose was 116. Urinalysis was negative on dip with a pH of 5.0 specific gravity 1.020, microscopic evaluation revealed six red blood cells per high power field with one white blood  cell per high-power field and no bacteria identified. Evaluation of the patient's left foot was consistent with a progressive wound infection. As such, the patient was admitted to the hospital for intravenous antibiotics (Zosyn and vancomycin). On hospital day No. 1,  10/28/2011, the patient was taken to the Operating Room by Dr. Ether Griffins where he underwent partial removal of the left proximal phalanx with Iof the gross infection at the prior amputation site. The night of surgery, the patient developed progressive wheezing with shortness of breath that was refractory to nebulized breathing treatments. The rapid response team was called and the patient was administered Lasix without improvement. An arterial blood gas revealed respiratory failure with a pH of 7.0, a PCO2 92 and a PO2  of 63. As such, the patient was intubated and transferred to the cardiac care unit   A chest x-ray was consistent with pulmonary edema and so the patient was given additional intravenous Lasix. Initial troponin level also returned elevated at 2.2. The patient's creatinine was also rising with a level of 1.59. The patient was diuresed a total of 1.2 liters and on hospital day No. 2,  10/29/2011, the patient's creatinine was noted to continue to rise with a level of 1.98. The patient had a stable leukocytosis at 12.8 thousand and the patient remained afebrile. A second troponin level remained further elevated at 8.8. An echocardiogram was obtained at 2:16 in the afternoon, which revealed mild left ventricular dilation with moderate to severe global hypokinesis and an ejection fraction of 25 to 35%. A moderate left pleural effusion was also appreciated. Given the patient's continued rise in creatinine, renal consultation with Dr. Mady Haagensen was  obtained. The quinapril was held. It was the opinion of Dr. Cherylann Ratel at that time that the acute renal insufficiency was related to his nausea and vomiting compounded by the respiratory failure  and acute myocardial infarction. Dr. Cherylann Ratel did not feel that acute dialysis was indicated. A renal ultrasound was obtained at 3:30 in the afternoon, which revealed a 1.59 cm stone in the right lower pole with a few additional smaller stones also noted. There was some prominence of the renal pelvis and renal infundibula noted raising the question of mild hydronephrosis versus ectasia. A 1.26 cm right renal cyst was also noted.   On hospital day No. 3, 10/30/2011, the serum creatinine continued to rise to 2.57. The patient remained nonoliguric with a 24 hour urine output of 825 mL with 175 mL of output from 7:00 a.m. to 9:00 a.m. with a decrease in the IV Lasix to 20 mg daily. Urology consultation was requested given the continued rise in creatinine and the findings on renal ultrasonography on the  31st of December. The patient remains intubated, however, is awake and responds appropriately to questions. The patient denies any current abdominal or flank pain or discomfort or discomfort in his genitalia. The patient's wife reported that the patient had been experiencing some right inguinal discomfort with gross hematuria in October 2012. When this persisted, the patient contacted his urologist, Dr. Assunta Gambles. A stone CT was obtained on 09/17/2011, which revealed stones in the lower pole of the right kidney measuring up to 4.7 mm. There was also an additional punctate calcification in the region of the right intrapolar kidney that was called felt to be possibly parenchymal. There were no left-sided renal stones or ureteral stones. There were no right ureteral stones. There was no hydronephrosis. Notably, the patient's pain and hematuria resolved after the stone CT raising the question of possible spontaneous stone passage without awareness. The patient did not experience any recurrent inguinal, abdominal or flank pain or gross hematuria. The patient does have a history of recurrent urolithiasis and is status post  multiple ESWLs in the past as well as percutaneous stone extractions.   PAST MEDICAL HISTORY.  1. Peripheral vascular disease, status post percutaneous transluminal angioplasty of the distal left SFA, popliteal, and posterior and anterior tibial arteries by Dr. Wyn Quaker on 10/10/2011 prior to the patient's left great toe amputation on 12/21.  2. 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  3. 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  4. 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  5. Hypertension.  6. Coronary artery disease, status post CABG x3 in 2005.  7. Status post aortic valve replacement on chronic Coumadin.  PAST SURGICAL HISTORY: As per the past medical history with the addition of multiple extracorporeal shockwave lithotripsies and percutaneous stone extractions.   ALLERGIES: The patient reports a sensitivity to oxycodone, which produces nausea.   MEDICATIONS ON ADMISSION.  1. Warfarin 5 mg alternating with 6 mg.  2. Simvastatin 20 mg p.o. daily.  3. Quinapril 40 mg p.o. daily.  4. Metoprolol 25 mg p.o. b.i.d.  5. Metformin 850 mg p.o. b.i.d.  6. Humulin N 50 units subcutaneous b.i.d.  7. Humulin R 25 units subcutaneous b.i.d.  8. Amlodipine 5 mg p.o. daily   FAMILY HISTORY: Significant for diabetes mellitus.  SOCIAL HISTORY: The patient is a former cigar smoker, having discontinued 15 years prior to admission. The patient  does not drink alcohol. The patient is a retired Buyer, retailfarmer and construction worker  REVIEW OF SYSTEMS: As per the history of present illness and as outlined in the detailed history and physical by Dr. Junie Panningheresa Tullo on 10/27/2011.   PHYSICAL EXAMINATION: The patient is currently intubated, but is alert and responds appropriately to basic questions.   Vital signs 96.7 degrees Fahrenheit, pulse 68 and regular, respirations 14 (intubated), blood  pressure 100/58 with a mean arterial blood pressure is 72, pulse oximetry 98% on the ventilator.   GENERAL: Well-developed, well-nourished, white male, intubated, in no apparent distress.   HEENT: Normocephalic, atraumatic, anicteric.   NECK:  No masses, no bruits, with a triple lumen catheter in the left internal jugular vein.   CHEST: Clear to auscultation with coarse mechanical mitral a close ventilatory sounds.   CARDIOVASCULAR: Distant heart sounds with a mechanical aortic valve click appreciated without murmurs, gallops, or rubs with 1+ radial pulses bilaterally and trace carotid pulses bilaterally.   ABDOMEN:  Abdominal examination revealed a nontender, nondistended abdomen without palpable masses or organomegaly. No costovertebral angle tenderness.   GENITOURINARY:  Examination revealed a normal uncircumcised phallus with a Foley catheter in good position draining lightly blood-tinged urine. The testes are descended bilaterally and are nontender and without masses.   EXTREMITY: Evaluation is without edema. The patient is status post right transmetatarsal amputation of all toes and the left foot is bandaged.   SKIN:  No lesions about the head and face or neck.   LABORATORY DATA: Glucose 143, BUN 48, creatinine 2.57, sodium 145, potassium 4.0, chloride 104, CO2 26, calcium 8.4, PTT 115.3, PT 26.9, INR 2.6, white blood cell count 7.8, hematocrit 33.6, platelet count 239, normal differential   ASSESSMENT:  1. Acute renal insufficiency. Likely due to prerenal causes with acute myocardial infarction with decreased ejection fraction and pulmonary edema resulting in respiratory failure.  2. Right nephrolithiasis. Renal ultrasonography yesterday without clear hydronephrosis. There is a discrepancy in the size of the right lower pole stones on renal ultrasonography compared to CT scanning in November. Nonetheless, do not suspect significant renal obstruction.   RECOMMENDATIONS: 1. Maximize  renal perfusion as presented in the context of the patient's decreased ejection fraction with recent acute MRI and pulmonary edema.  2. Consider repeat renal ultrasonography versus noncontrasted CT of the abdomen and pelvis if the renal function continues to decrease. If clinically feasible,  the CT scan would be preferred.    ____________________________ Marin OlpJay H. Caelin Rayl, MD [id number] D: 10/30/2011 12:36:00 ET T: 10/31/2011 10:17:04 ET JOB#: [id number]  cc: Marin OlpJay H. Lama Narayanan, MD, <Dictator> Munsoor Lizabeth LeydenN. Lateef, MD Marin OlpJAY H Bradly Sangiovanni MD ELECTRONICALLY SIGNED 12/15/2011 12:43

## 2015-02-20 NOTE — H&P (Signed)
PATIENT NAME:  Joe [name], [name] MR#:  [id number] DATE OF BIRTH:  April 09, 1941  DATE OF ADMISSION:  10/27/2011  PRIMARY CARE PHYSICIAN: Alonna BucklerAndrew Lamb, MD   ADMITTING PHYSICIAN: Duncan Dulleresa Luverta Korte, MD   CHIEF COMPLAINT: Infected foot.   HISTORY OF PRESENT ILLNESS: Mr. Joe [name] is a 74 year old white male with a history of diabetes mellitus x30 years with neuropathy and peripheral artery disease, with prior amputations for gangrene, who underwent a left great toe amputation on December 21st by Dr. Ether GriffinsFowler secondary to gangrene. He was seen by Dr. Ether GriffinsFowler on December 27th for postoperative follow-up and was started on doxycycline for increased redness and concern for foot infection. He developed nausea and vomiting on the day of admission accompanied by fevers and presented to the ED with signs and symptoms consistent with postoperative wound infection. Per family, the redness that was noted on his foot had progressed from surrounding the base of the great toe amputation site to mid tarsal. The patient denies any pain although he does have a neuropathy. He did not have any nausea and vomiting until the day of admission. He did have an influenza vaccine on Thursday afternoon as well.   PAST MEDICAL HISTORY:  1. Peripheral vascular disease, status post percutaneous transluminal angioplasty of the distal left SFA, popliteal, and posterior and anterior tibial arteries by Dr. Wyn Quakerew on December 12th preoperatively.  2. Amputation of left great toe on December 21st by Dr. Ether GriffinsFowler secondary to gangrene.  3. History of osteomyelitis of the right foot March 2012 requiring eventual right transmetatarsal amputation of all toes.  4. Diabetes mellitus x30 years with neuropathy.  5. History of hypertension.  6. History of diabetes.  7. History of aortic valve replacement, on chronic Coumadin.  8. Coronary artery disease with three-vessel bypass in 2005.   PAST SURGICAL HISTORY:  1. Three-vessel CABG in 2005.  2. Aortic valve  replacement in 2005.  3. History of right kidney stone lithotripsy and percutaneous removal by Dr. Achilles Dunkope in the distant past.   CURRENT MEDICATIONS:  1. Warfarin 5 mg alternating with 6 mg.  2. Simvastatin 20 mg daily. 3. Quinapril 40 mg daily. 4. Metoprolol succinate 25 mg twice daily.  5. Metformin 850 mg twice daily.  6. Humulin N 50 units subcutaneously twice daily.  7. Humulin R 25 units subcutaneously twice daily. 8. Amlodipine 5 mg daily.   ALLERGIES: The patient's allergies include oxycodone, which he states makes him nauseous. He denies any antibiotic allergies.   FAMILY HISTORY: Notable for diabetes mellitus in multiple maternal relatives. Both his mother and his sister died of complications from diabetes. His father's past medical history is noncontributory.   SOCIAL HISTORY: He is an ex-smoker, having quit 15 years ago. He is a nondrinker. He is a retired Buyer, retailfarmer and construction worker.   REVIEW OF SYSTEMS: CONSTITUTIONAL: Review of systems is positive for fever without fatigue, weight changes, and weakness. HEENT: He denies any recent changes in vision. He denies any recent hearing loss, postnasal drip or sinus pain. RESPIRATORY: He denies any recent cough, wheezing, or hemoptysis and has no history of dyspnea. CARDIOVASCULAR:  He denies chest pain, orthopnea, and edema. He denies dyspnea on exertion and recent palpitations. GI: History is notable for nausea and vomiting without any changes in bowel habits. GU: He denies dysuria. He does have a history of renal calculi but has no recent signs or symptoms of such. He does have some polyuria and nocturia at night but not more than twice per night.  HEMATOLOGIC/LYMPHATIC: He has no history of anemia or easy bruising or bleeding but does take Coumadin. MUSCULOSKELETAL: He has no history of recent neck, back, shoulder, knee or hip pain. NEUROLOGICAL: He does have chronic numbness of both lower extremities secondary to diabetic neuropathy. No  history of strokes or seizures. PSYCHIATRIC: He has no history of anxiety, insomnia or depression.    PHYSICAL EXAMINATION:  GENERAL: This is a well nourished overweight male in no apparent distress.   VITAL SIGNS: On admission, temperature was 101.9, blood pressure 142/75, pulse is 96, respirations 20. He is sating 95% on room air.   HEENT: Pupils are equal, round, and reactive to light. Extraocular movements are intact.   NECK: Supple without lymphadenopathy, JVD, thyromegaly, or carotid bruits.   LUNGS: Clear to auscultation bilaterally with no wheezing or rhonchi.   CARDIOVASCULAR: Regularly irregular with the click of aortic valve replacement noted.   ABDOMEN: Soft, nontender, and nondistended with good bowel sounds and no evidence of hepatosplenomegaly.   MUSCULOSKELETAL: On exam, he  is moving all extremities. His left lower extremity is warm.   SKIN: His skin is notable for redness to the mid metatarsal on the left foot. There is no purulent drainage from the amputation site. He has no cervical, axillary, inguinal, or supraclavicular lymphadenopathy. The patient is neurologically intact with the exception of sensation to the lower extremity. He is alert and oriented to person, place, and time.   ADMISSION LABORATORY, DIAGNOSTIC AND RADIOLOGICAL DATA:  White count is 12.9. Hemoglobin is 13.4. Platelets are 273. He has a slight neutrophilic shift and neutrophil absolute count is 11.3.  Coags: PT and INR is 18.4 and 1.5 today.  Rapid influenza test is negative for A and B.  Sodium is 138, potassium 3.8, chloride 101, bicarbonate 27, BUN 22, creatinine 1.19 and glucose 116. Lipase is 117.   ASSESSMENT AND PLAN:  1. Diabetic foot infections with recent podiatric surgery with amputation of left toe: We will admit for broad-spectrum antibiotics with Zosyn and vancomycin. Blood cultures were drawn in the ED prior to initiation of antibiotic therapy. Dr. Ether Griffins will see the patient in the  morning.  2. Diabetes mellitus: We will continue the patient's home regimen and add sliding scale and titrate insulin doses as needed for elevated blood sugars in the setting of a diabetic foot infection.  3. History of coronary artery disease: The patient is currently asymptomatic. We will continue home medications.  4. Peripheral vascular disease: The patient has palpable pulses in the left foot, and the foot is warm to palpation. We will continue aspirin and Coumadin at this time as his INR is 1.5. It is unclear whether he will need to have surgery or debridement at this point, so we will continue Coumadin and follow pro time daily.  5. He is no longer a smoker, so tobacco cessation counseling was not necessary.   CODE STATUS:  The patient is a FULL CODE.    ESTIMATED TIME OF CARE: 45 minutes.   ____________________________ Duncan Dull, MD tt:[id number] D: 10/27/2011 20:13:02 ET T: 10/28/2011 08:50:31 ET JOB#: [id number]  cc: Duncan Dull, MD, <Dictator> Duncan Dull MD ELECTRONICALLY SIGNED 11/30/2011 18:22

## 2015-02-20 NOTE — Discharge Summary (Signed)
PATIENT NAME:  Joe [name], Zeno M MR#:  [id number] DATE OF BIRTH:  Apr 14, 1941  DATE OF ADMISSION:  10/27/2011 DATE OF DISCHARGE:  11/09/2011  DISCHARGE DIAGNOSES:  1. Diabetic foot infection.  2. Acute respiratory failure.  3. Acute systolic congestive heart failure.  4. Acute non-ST-segment elevation myocardial infarction.  5. Diabetes mellitus.    6. Peripheral artery disease.  7. Hypertension.  8. Hyperlipidemia.   9. Prosthetic aortic valve replacement a. On chronic anticoagulation 10.  Coronary artery disease.   DISCHARGE MEDICATIONS:  1. Norco 5/325 one to two q.6 p.r.n.  2. Metoprolol 25 mg b.i.d.  3. Amlodipine 5 mg daily.  4. Humulin R 25 units b.i.d. q.a.c.  5. Humulin N 50 units b.i.d. q.a.c.  6. Simvastatin 20 mg daily.  7. Coumadin 7.5 mg daily.  8. Ampicillin 500 mg 2 tabs b.i.d.  9. Lasix 40 mg daily.  10. Hydralazine 10 mg t.i.d.  11. Aspirin 325 mg daily.   REASON FOR ADMISSION: This is a 74 year old male, has history of peripheral vascular disease with prior amputations. He underwent a left great toe amputation earlier in December.  In postoperative followup he was started on doxycycline for increased redness around the foot. It was noticed that this was increasing. The patient was not feeling well and came into the emergency room and was admitted for further treatment.   HOSPITAL COURSE:  1. Diabetic foot infection. The patient was started on antibiotics. The patient was seen by Dr Ether GriffinsFowler who performed some debridement and was switched to ampicillin which he will continue as an outpatient until followup.  2. Acute systolic congestive heart failure. The patient was treated with IV diuretics.  EF showed low ejection fraction around 25%. He could not tolerate ACE inhibitor because of his renal function. He was placed on diuretics, hydralazine, and tolerating this well.  3. Peripheral vascular disease. He is on anticoagulation and tolerating that well. He follows with  Dr Wyn Quakerew.  4. Prosthetic aortic valve. We bridged him with heparin while holding his Coumadin for procedures. He is now back on his Coumadin and is therapeutic with an INR 2.5. He is on 7.5 mg of Coumadin. We will have this rechecked in 2 days at Dr. Jasper LoserLamb's office so that this can be adjusted if necessary.  5. Non-ST-segment elevation myocardial infarction.  Patient had elevated cardiac enzymes. He was evaluated at this point with other medical problems. Patient is not going to be cath'd during this admission. He was started on aspirin and beta blocker and is tolerating well with no further problems.   DISPOSITION: The patient is discharged in stable condition. He is to follow up with Dr. Ether GriffinsFowler for the wound.  He is also to follow up at Dr. Jasper LoserLamb's office on Monday for recheck of his INR.    TIME SPENT ON DISCHARGE: 45 minutes.      ____________________________ Gracelyn NurseJohn D. Peytyn Trine, MD jdj:[id number] D: 11/09/2011 09:36:31 ET T: 11/10/2011 13:35:44 ET JOB#: [id number]  cc: Gracelyn NurseJohn D. Dava Rensch, MD, <Dictator> Reola MosherAndrew S. Randa LynnLamb, MD Argentina DonovanJustin A. Ether GriffinsFowler, DPM Munsoor Lizabeth LeydenN. Lateef, MD Annice NeedyJason S. Dew, MD Gracelyn NurseJOHN D Chalise Pe MD ELECTRONICALLY SIGNED 11/11/2011 20:00

## 2015-02-20 NOTE — Op Note (Signed)
PATIENT NAME:  Joe [name], Dugan M MR#:  [id number] DATE OF BIRTH:  02-Jan-1941  DATE OF PROCEDURE:  04/02/2012  PREOPERATIVE DIAGNOSES:  1. Infection of left below-knee amputation site stump.  2. Diabetes mellitus.  3. Peripheral vascular disease.  4. Renal insufficiency.   POSTOPERATIVE DIAGNOSES: 1. Infection of left below-knee amputation site stump.  2. Diabetes mellitus.  3. Peripheral vascular disease.  4. Renal insufficiency.   PROCEDURE: Irrigation and excisional debridement of skin, soft tissue, and muscle to the left below-knee amputation site over approximately 50 sq cm with VAC dressing placement.   SURGEON: Annice NeedyJason S. Dew, MD   ANESTHESIA: General.   ESTIMATED BLOOD LOSS: Approximately 25 mL.   INDICATION FOR PROCEDURE: The patient is a 74 year old white male with nonhealing left below-knee amputation site. This was healing reasonably well until a fall and the wound opened. There is some local infection and nonviable tissue that needs to be debrided. He is brought to the operating room for such. Risks and benefits were discussed. Informed consent was obtained.   DESCRIPTION OF PROCEDURE: The patient was brought to the operative suite and after an adequate level of general anesthesia was obtained, the left lower extremity was sterilely prepped and draped and a sterile surgical field was created. There was some necrotic eschar overlying the muscle that was debrided sharply with Metzenbaum scissors and a scalpel. Over the midportion of the wound was some nonviable muscle that was debrided back similarly with Metzenbaum scissors and a scalpel as well as electrocautery. Few bleeders were controlled with electrocautery. There was also some skin edge with some necrotic eschar in subcutaneous tissue. The bone was not directly exposed although the wound in question was reasonably deep. It was inferior to the bone by and large. At this point the wound was irrigated and a VAC sponge was cut to  fit the wound. Strips of Ioban were used to obtain an occlusive seal. A seal was obtained and it was connected to 125 cm of suction. The patient tolerated the procedure well and was taken to the recovery room in stable condition.   ____________________________ Annice NeedyJason S. Dew, MD jsd:drc D: 04/02/2012 08:39:39 ET T: 04/02/2012 09:41:23 ET JOB#: [id number]  cc: Annice NeedyJason S. Dew, MD, <Dictator>  Annice NeedyJASON S DEW MD ELECTRONICALLY SIGNED 04/03/2012 13:14

## 2015-02-20 NOTE — Consult Note (Signed)
PATIENT NAME:  Joe [name], Joe [name] MR#:  [id number] DATE OF BIRTH:  09-Nov-1940  DATE OF CONSULTATION:  10/28/2011  CONSULTING PHYSICIAN:  Jill Alexanders A. Ether Griffins, DPM  REASON FOR CONSULTATION: Left foot diabetic infection.   HISTORY OF PRESENT ILLNESS: The patient is a 74 year old gentleman who approximately 1-1/2 weeks prior to admission underwent a left great toe amputation secondary to gangrenous changes on his left great toe. He had an uneventful course for the next week. He was seen on Thursday, four days ago, with some noted erythema to the dorsal aspect of his left foot. No overt purulent drainage was noted. No lymphangitic streaking was noted at that time. He was started on doxycycline for coverage of infection which he had responded to prior to surgery due to some superficial contamination. Then this past weekend on Saturday, yesterday, he developed nausea and vomiting with a fever, was brought into the ER and had a noted white blood cell count of 12,000 with increased erythema per the patient and his family. At that time he was admitted, started on IV vancomycin with Zosyn, and I was consulted.   PAST MEDICAL HISTORY:  1. Diabetic neuropathy.  2. Peripheral vascular disease.  3. Coronary artery disease, status post bypass graft. 4. History of mechanical aortic valve replacement, on Coumadin.   CURRENT MEDICATIONS: Warfarin 5 mg daily, metoprolol, amlodipine, HCTZ, Humulin, metformin, Cipro, doxycycline, quinapril, simvastatin.   ALLERGIES: Oxycodone and cephalexin.   SOCIAL HISTORY: He lives at home with his wife, is retired.   REVIEW OF SYSTEMS: Per history of present illness, he has developed vomiting over the weekend with some fever. He is neuropathic. He does not complain of pain to the lower extremities.   PHYSICAL EXAMINATION:  GENERAL: He is alert and oriented, in no apparent distress.   VASCULAR: He has nonpalpable dorsalis pedis and posterior tibial pulses consistent with his  history of peripheral vascular disease. He is status post lower extremity angioplasty by [facility] within the last month.   NEUROLOGICAL: He is completely neuropathic to the lower extremities.   DERMATOLOGICAL: On the distal aspect of his left great toe amputation site, he has a noted area of erythema basically around the great toe joint area. This has apparently improved from last evening as the erythema does not extend up into the midfoot as was described by the admitting physician. Upon debridement of a bit of necrotic tissue from the wound flap, there was noted to be purulence from the wound flap and amputation site. This was cultured at this time.   ORTHOPEDIC/MUSCULOSKELETAL: Minimal edema to the left foot. He has range of motion of his ankle which is within normal limits at this time.   LABORATORY DATA: Upon admission, white blood cell count was 12.9, currently 10.1, hemoglobin 12.7, hematocrit 37.7.   ASSESSMENT:  1. Diabetic neuropathic peripheral vascular disease.  2. Diabetic foot infection, status post left great toe amputation.   PLAN: I will place him n.p.o. At this point, he needs I and D and removal of residual bone in this wound site. I did culture this wound today and will send it for aerobic, anaerobic testing. He is to be n.p.o. at this time. He has not eaten since midnight. He did have less than 2 ounces of diet ginger ale from midnight to 7:00 a.m. this morning.   ____________________________ Argentina Donovan. Ether Griffins, DPM jaf:cbb D: 10/28/2011 08:17:20 ET T: 10/28/2011 13:12:32 ET JOB#: [id number]  cc: Jill Alexanders A. Ether Griffins, DPM, <Dictator> Dayanara Sherrill DPM  ELECTRONICALLY SIGNED 11/29/2011 15:13

## 2015-02-20 NOTE — Consult Note (Signed)
[name] NAME:  Joe [name], Joe [name] MR#:  [id number] DATE OF BIRTH:  1941/10/28  DATE OF CONSULTATION:  10/29/2011  REFERRING PHYSICIAN:  Alonna BucklerAndrew Lamb, MD  ADMITTING PHYSICIAN:  Prime Doc.  CONSULTING PHYSICIAN:  Bhavik Cabiness D. Shirlene Andaya, MD   INDICATION:  Respiratory failure and cellulitis with gangrene, aortic valve replacement with cardiomyopathy and congestive heart failure.   HISTORY OF PRESENT ILLNESS: Joe [name] is a 74 year old male well known to me with diabetes, peripheral neuropathy, peripheral vascular disease, recent amputation for gangrene on Joe left foot  by Dr. Ether GriffinsFowler, cardiomyopathy, aortic valve replacement, hypertension, coronary artery disease, who again recently had left toe amputation, had his recent anticoagulation adjusted and was placed on Lovenox and had surgery performed and was re-placed on Coumadin, was doing well but had worsening infection of Joe foot. He came to Emergency Room,  was admitted and evaluated, but then subsequently had respiratory failure on Joe floor presumably from congestive heart failure and was placed in Joe Intensive Care Unit, intubated and sedated.  Review of systems unobtainable. Joe [name] is intubated and sedated at this point.   PAST MEDICAL HISTORY:  Peripheral vascular disease status post percutaneous transluminal coronary angioplasty of Joe distal SFA and popliteal artery by Dr. Wyn Quakerew earlier in Joe month. Amputation of left foot by Ether GriffinsFowler, history of osteomyelitis, diabetes, hypertension, aortic valve disease, coronary artery disease, cardiomyopathy, diabetic neuropathy, obesity.   PAST SURGICAL HISTORY: Three vessel coronary bypass, and aortic valve replacement with a mechanical valve, right kidney stone lithotripsy.   MEDICATIONS:  Warfarin 5 mg alternating with 6, simvastatin 20, quinapril, 40 metoprolol succinate 25 twice a day. Metformin 850 twice a day. Humulin N 50 units subcutaneous twice a day. Humulin R 25 units subcutaneous once a day.  Amlodipine 5 mg daily.   ALLERGIES: Oxycodone.   FAMILY HISTORY: Diabetes, hypertension.   SOCIAL HISTORY: Ex-smoker. No alcohol consumption, retired. Former Corporate investment bankerconstruction worker.   PHYSICAL EXAMINATION:  VITAL SIGNS: [name] is intubated, sedated.   VITALS: Blood pressure 150/80, pulse 100, respiratory rate 16, pulse 90 and regular.   HEENT: Normocephalic, atraumatic. Pupils equal and reactive to light Normal cephalic .   NECK: Supple. Mild jugular venous distention.   LUNGS: Bilateral rhonchi. Mild rales and wheezing and rhonchi on Joe vent.   HEART: Distant click from Joe aortic valve. Regular. systolic ejection murmur apex.   ABDOMEN:  Benign.  EXTREMITIES: 2+ edema.   NEUROLOGIC: Intact, intubated, sedated, so neurologic exam also limited.  Laboratory, diagnostic and radiological data: White count of 13, hemoglobin 13, and platelet count 273, PT was 18.4 on admission. Influenza was negative. Sodium 138, potassium 3.8, BUN 22, creatinine 1.19, glucose 116, lipase 117.   EKG: Normal sinus rhythm, nonspecific findings.   ASSESSMENT:  1. Respiratory failure.  2. Congestive heart failure. 3. Cardiomyopathy. 4. Diabetes. 5. Hypertension. 6. Coronary artery disease. 7. Hyperlipidemia. 8. Peripheral vascular disease. 9. Gangrene. 10. Peripheral neuropathy.  PLAN: I agree place on Joe vent, continue respiratory support. Continue pulmonary support. Continue inhalers, continue steroid therapy, broad-spectrum antibiotics both for respiratory failure and possible bronchitis and pneumonia as well as peripheral vascular disease with cellulitis and infection with gangrene. Continue diabetes management. Continue hypertension management. Continue beta-blockade therapy. ACE inhibitor therapy should be up to date and continued. Continue Lasix therapy for heart failure. Continue to watch fluid support and reduce fluid intake and watch fluid balance for his heart failure and cardiomyopathy,  continue peripheral neuropathy evaluation and infection from cellulitis. Pulmonary input and critical care  input should be continued. Echocardiogram will be helpful to assess his aortic valve and watch for endocarditis     ____________________________ Bobbie Stack. Juliann Pares, MD ddc:ljs D: 10/30/2011 00:00:41 ET T: 10/31/2011 09:13:11 ET JOB#: [id number]  cc: Jamesyn Moorefield D. Juliann Pares, MD, <Dictator> Reola Mosher. Randa Lynn, MD Alwyn Pea MD ELECTRONICALLY SIGNED 11/15/2011 14:18

## 2015-02-20 NOTE — Op Note (Signed)
PATIENT NAME:  Joe [name], Joe [name] MR#:  [id number] DATE OF BIRTH:  1941/07/25  DATE OF PROCEDURE:  10/28/2011  PREOPERATIVE DIAGNOSIS: Postoperative infection left great toe amputation site.   POSTOPERATIVE DIAGNOSIS: Postoperative infection left great toe amputation site.  PROCEDURE: Removal of residual proximal phalanx left great toe amputation site with I and D of infection.   SURGEON: Cayne Yom A. Ether GriffinsFowler, DPM   ANESTHESIA: IV sedation with local.   HEMOSTASIS: None.   COMPLICATIONS: None.   SPECIMEN: Bone for pathology and culture.   ESTIMATED BLOOD LOSS: Minimal.   OPERATIVE INDICATIONS: The patient is a 74 year old gentleman who was admitted through the Emergency Department yesterday with nausea, vomiting, and redness with an elevated white blood cell count. He had previously undergone a left great toe amputation by myself approximately 1-1/2 weeks ago. He developed the systemic signs of an infection, was admitted, and upon evaluation had noted purulent drainage from the stump site of the amputation site. At that time I discussed with the patient removal of residual bone from the area and I and D of the wound, and he consented. All risks, benefits alternatives, and complications associated with the surgery were discussed with the patient, and informed consent has been given.   OPERATIVE PROCEDURE: The patient was brought into the Operating Room and placed on an operating table in supine position. IV sedation was administered by the Anesthesia team. A local block was placed around the first ray region. The left lower extremity was prepped and draped in the usual sterile fashion. Attention was directed to the distal aspect of the left great toe amputation site where this was opened up. There was noted to be some necrotic tissue to the plantar flap. This was removed away full thickness. At this time, there was noted to be purulent drainage around the residual base of the proximal phalanx that  had been left intact, and I disarticulated this from the MTPJ and removed this from the surgical field in toto. The wound was then flushed with 3 liters of saline, and the skin edges and surrounding tissue was debrided with a VersaJet debrider. This was an excisional type of debridement as well as complete excision of the residual proximal phalanx. Minimal bleeding was noted from the wound. The skin edges were loosely closed with a 4-0 nylon with an 0.5 cm area left open and packed for continued drainage. A well compressive bulky sterile dressing was placed on the left foot. Of note, he has a small pre-decubitus area to the lateral fifth metatarsal head. This was well padded with an ABD pad. Also of note on fluoroscopy as well as previous x-rays, there was a foreign body in the fourth intermetatarsal space. This area was inspected thoroughly. There were no signs of any superficial skin regions. Suspicion is this is a partial needle that the patient likely stepped on and is not involved in this infectious process. He will be readmitted to the floor. He is on Zosyn and vancomycin. I will keep close monitor of him with dressing changes and packing and hopefully discharge in the next 1 to 2 days.   ____________________________ Argentina DonovanJustin A. Ether GriffinsFowler, DPM jaf:cbb D: 10/28/2011 15:05:09 ET T: 10/28/2011 15:16:45 ET JOB#: [id number]  cc: Jill AlexandersJustin A. Ether GriffinsFowler, DPM, <Dictator> Giana Castner DPM ELECTRONICALLY SIGNED 11/29/2011 15:13

## 2015-02-20 NOTE — Consult Note (Signed)
Chief Complaint:   Subjective/Chief Complaint Pt intubated sedated. He is arousable.   VITAL SIGNS/ANCILLARY NOTES: **Vital Signs.:   01-Jan-13 12:00   Vital Signs Type Routine   Temperature Source oral   Pulse Pulse 72   Pulse source per cardiac monitor   Respirations Respirations 16   Systolic BP Systolic BP 524   Diastolic BP (mmHg) Diastolic BP (mmHg) 60   Mean BP 78   Pulse Ox % Pulse Ox % 98   Oxygen Delivery Ventilator Assisted   Pulse Ox Heart Rate 72  *Intake and Output.:   01-Jan-13 11:00   Grand Totals Intake:  45.8 Output:      Net:  45.8 24 Hr.:  -33.3   Heparin      In:  19.5   IV (Primary)      In:  5.2   IV (Primary)      In:  8.6   IV (Secondary)      In:  12.5   Brief Assessment:   Cardiac Regular  murmur present  + carotid bruits  + LE edema  clicks    Respiratory normal resp effort  rhonchi  crackles    Gastrointestinal Normal    Gastrointestinal details normal Soft  Nontender  Nondistended  No masses palpable  Bowel sounds normal  No rebound tenderness  No gaurding  No rigidity   Misc Urine Chem:  01-Jan-13 00:05    Creatinine, Urine 102.0   Protein, Random Urine 117   Protein/Creat Ratio (comp) 1147  Routine Hem:  01-Jan-13 03:12    WBC (CBC) 7.8   RBC (CBC) 3.87   Hemoglobin (CBC) 11.2   Hematocrit (CBC) 33.6   Platelet Count (CBC) 239   MCV 87   MCH 29.0   MCHC 33.3   RDW 15.0   Neutrophil % 77.6   Lymphocyte % 12.3   Monocyte % 9.2   Eosinophil % 0.5   Basophil % 0.4   Neutrophil # 6.1   Lymphocyte # 1.0   Monocyte # 0.7   Eosinophil # 0.0   Basophil # 0.0  Routine Chem:  01-Jan-13 03:12    Glucose, Serum 143   BUN 48   Creatinine (comp) 2.57   Sodium, Serum 145   Potassium, Serum 4.0   Chloride, Serum 104   CO2, Serum 26   Calcium (Total), Serum 8.4   Osmolality (calc) 304   eGFR (African American) 32   eGFR (Non-African American) 27   Anion Gap 15  Routine Coag:  01-Jan-13 03:12    Prothrombin 26.9   INR 2.6    Activated PTT (APTT) 115.3    14:35    Activated PTT (APTT) > 160.0   Radiology Results: XRay:    29-Dec-12 15:38, Chest PA and Lateral   Chest PA and Lateral    REASON FOR EXAM:    fever, cough  COMMENTS:       PROCEDURE: DXR - DXR CHEST PA (OR AP) AND LATERAL  - Oct 27 2011  3:38PM     RESULT: Comparison: None    Findings:     PA and lateral chest radiographs are provided.  There is no focal   parenchymal opacity, pleural effusion, or pneumothorax. The heart and   mediastinum are unremarkable. There is evidence of prior median   sternotomy. The osseous structures are unremarkable.    IMPRESSION:   No acute disease of the chest.          Verified By: Elbert Ewings  P. PATEL, M.D., MD    29-Dec-12 15:43, Foot Left Complete   Foot Left Complete    REASON FOR EXAM:    left foot s/p great toe amputation, with redness,   swelling.  r/o free air  COMMENTS:       PROCEDURE: DXR - DXR FOOT LT COMP W/OBLIQUES  - Oct 27 2011  3:43PM     RESULT: Comparison:  None    Findings:    AP, oblique, and lateral views of the right foot demonstrates no fracture   or dislocation. There is evidence of prior indication of the distal two   thirds of the first proximal phalanx. There is no soft tissue   abnormality. There is a linear radiopaque foreign body in the plantar   soft tissues between the fourth and fifth metatarsal heads.  IMPRESSION:     No acute osseous injury of the right foot.    There is a linear radiopaque foreign body in the plantar soft tissues   between the fourth and fifth metatarsal heads.    These findings were communicated to Robb Matar on 10/27/2011 at 1730   hours.          Verified By: Jennette Banker, M.D., MD    30-Dec-12 20:10, Chest Portable Single View   Chest Portable Single View    REASON FOR EXAM:    hypoxia  COMMENTS:       PROCEDURE: DXR - DXR PORTABLE CHEST SINGLE VIEW  - Oct 28 2011  8:10PM     RESULT: Comparison: 10/27/2011    Findings:      Single portable AP chest radiograph is provided. There is bilateral   perihilar interstitial thickening with indistinctness of the central   pulmonary vasculature. There is no focal parenchymal opacity, pleural   effusion, or pneumothorax. The heart size is stable.. The osseous   structures are unremarkable.    IMPRESSION:     The overall findings are most concerning for pulmonary edema.          Verified By: Jennette Banker, M.D., MD    30-Dec-12 22:57, Chest Portable Single View   Chest Portable Single View    REASON FOR EXAM:    central line placement  COMMENTS:       PROCEDURE: DXR - DXR PORTABLE CHEST SINGLE VIEW  - Oct 28 2011 10:57PM     RESULT: Comparison: 10/27/2011     Findings:     Single portable AP chest radiograph is provided. There is an endotracheal   tube in satisfactory position with the tip 4.3 cm above the carina. There   is a left-sided is a venous catheter with the tip projecting over the   SVC. There is bilateral perihilar interstitial thickening with   indistinctness of the central pulmonary vasculature. There is no focal   parenchymal opacity, pleural effusion, or pneumothorax. The heart size is     stable.. The osseous structures are unremarkable.    IMPRESSION:      The overall findings are most concerning for pulmonary edema.          Verified By: Jennette Banker, M.D., MD    01-Jan-13 10:10, Chest Portable Single View   Chest Portable Single View    REASON FOR EXAM:    respiratory failure, recent pulmonary edema.  COMMENTS:       PROCEDURE: DXR - DXR PORTABLE CHEST SINGLE VIEW  - Oct 30 2011 10:10AM     RESULT: Comparison:  10/28/2011    Findings:  The enteric tube is difficult to visualize secondary to underpenetration,   but appears to extend at least below the carina. Remainder of support   apparatus stable. Heart and mediastinum are stable. The lung volumes are   low. There is slight interval decrease in bilateral  heterogeneous and   interstitial pulmonary opacities. Retrocardiac opacity is unchanged. This   may represent atelectasis. Infection is not excluded.    IMPRESSION:   1. Slight interval decrease in pulmonary edema.  2. Unchanged retrocardiac opacity.          Verified By: Gregor Hams, M.D., MD  [place]:    31-Dec-12 15:30, US Kidney Bilateral   US Kidney Bilateral    REASON FOR EXAM:    acute renal failure  COMMENTS:       PROCEDURE: [place]  - US KIDNEY  - Oct 29 2011  3:30PM     RESULT: The right kidney measures 10.57 cm x 5.19 cm x 5.86 cm and the   left kidney measures 12.55 cm x 5.61 cm x 5.16 cm. There is a 1.59cm   nonobstructive stone at the lower pole of the right kidney. A few   additional smaller stones are also seen. There is a 1.26 cm cyst of the   right kidney. There is noted prominence of the renal pelvis and renal   infundibula consistent with mild hydronephrosis or ectasia. No renal mass   or hydronephrosis is seen on the left. The urinary bladder is empty and   is not visualized adequately for evaluation on this exam. No ascites is   seen.    IMPRESSION:   1. There is mild prominence of the right renal collecting system   compatible with ectasia or mild hydronephrosis.  2. Multiple nonobstructive right renal stones are seen.  3. There is a 1.26 cm cyst of the right kidney.    Thank you for the opportunity to contribute to the care of your patient.           Verified By: Dionne Ano WALL, M.D., MD  Lab:    30-Dec-12 20:30, ABG   pH (ABG) 7.00   PCO2 92   PO2 63   FiO2 100   Base Excess -10.5   HCO3 22.7   O2 Saturation 76.5   O2 Device nrb   Specimen Site (ABG)    RT RADIAL   Specimen Type (ABG) ARTERIAL   Patient Temp (ABG) 37.0    30-Dec-12 21:55, ABG   pH (ABG) 7.25   PCO2 42   PO2 112   FiO2 100   Base Excess -8.5   HCO3 18.4   O2 Saturation 97.6   Specimen Site (ABG)    RT BRACHIAL   Specimen Type (ABG) ARTERIAL   Patient Temp (ABG) 37.0    Mode    ASSIST CONTROL   Vt 500   PEEP 5.0   Mechanical Rate 12   Result(s) reported on 28 Oct 2011 at 09:59PM.    31-Dec-12 04:40, ABG   pH (ABG) 7.32   PCO2 45   PO2 295   FiO2 100   Base Excess -3.1   HCO3 23.2   O2 Saturation 99.9   O2 Device 840   Specimen Site (ABG)    RT RADIAL   Specimen Type (ABG) ARTERIAL   Patient Temp (ABG) 37.0   Mode    ASSIST CONTROL   Vt 500   PEEP 5.0   Mechanical  Rate 14   Result(s) reported on 29 Oct 2011 at 04:49AM.  Cardiology:    30-Dec-12 21:58, ECG   Ventricular Rate 122   Atrial Rate 122   QRS Duration 108   QT 352   QTc 501   R Axis 38   T Axis 169   ECG interpretation    Sinus tachycardia  with premature ventricular or aberrantly conducted complexes  ST & T wave abnormality, consider lateral ischemia  Abnormal ECG  When compared with ECG of 26-Oct-2010 23:36,  Vent. rate has increased BY  50 BPM  ST now depressed in Lateral leads  Confirmed by Mariah Milling, TIMOTHY ([id number]) on 10/29/2011 10:23:02 PM    Overreader: Julien Nordmann   ECG     31-Dec-12 04:49, ECG   Ventricular Rate 65   Atrial Rate 65   P-R Interval 172   QRS Duration 116   QT 452   QTc 470   P Axis 54   R Axis 29   T Axis 132   ECG interpretation    Normal sinus rhythm  Non-specific intra-ventricular conduction delay  Nonspecific T wave abnormality  Prolonged QT  Abnormal ECG  When compared with ECG of 28-Oct-2011 21:58,  Significant changes have occurred  Confirmed by Mariah Milling, TIMOTHY ([id number]) on 10/29/2011 10:16:56 PM    Overreader: Julien Nordmann   ECG     31-Dec-12 14:16, Echo Doppler   Echo Doppler    Interpretation Summary    The left ventricle is mildly dilated. There is no thrombus. Left   ventricular systolic function is moderate to severely reduced.   Ejection Fraction = 25-35%. There is normal left ventricular wall   thickness. There is moderate to severe global hypokinesis of the   left ventricle. The right ventricle is mild to  moderately dilated.   The right ventricular systolic function is moderately reduced. Mild   valvular aortic stenosis. Mild aortic regurgitation. 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 Moderate size left   pleural effusion.    PatientHeight: 170 cm    PatientWeight: 86 kg    BSA: 2.0 m2    Procedure:    A two-dimensional transthoracic echocardiogram with color flow and   Doppler was performed.    The study was completed  in the intensive care unit.    Left Ventricle    Left ventricular systolic function is moderate to severely reduced.    Ejection Fraction = 25-35%.    There is moderate to severe global hypokinesis of the left ventricle.    The left ventricle is mildly dilated.    There is no thrombus.    There is normal left ventricular wall thickness.    Right Ventricle    The right ventricle is mild to moderately dilated.    There is normal right ventricular wall thickness.  The right ventricular systolic function is moderately reduced.    Atria    Borderline left atrial enlargement.    Borderline right atrial enlargement.    Mitral Valve    The mitral valve leaflets appear thickened, but open well.    There is mild mitral regurgitation.    Tricuspid Valve    The tricuspid valve is not well visualized, but is grossly normal.    There is mild to moderate tricuspid regurgitation.    Right ventricular systolic pressure is elevated at 30-19mmHg.    Aortic Valve    Mild valvular aortic stenosis.    Mild aortic regurgitation.  There are no vegetations on this prosthetic aortic valve.    Pulmonic Valve    The pulmonic valve is not well seen, but is grossly normal.    There is no pulmonic valvular regurgitation.    Great Vessels    The aortic root is not well visualized but is probably normal size.    Pericardium/Pleural    Moderate size left pleural effusion.    No pericardial effusion.    MMode 2D Measurements and Calculations    RVDd: 3.2  cm    IVSd: 1.1 cm    LVIDd: 5.1 cm    LVIDs: 4.4 cm    LVPWd: 1.3 cm    FS: 14 %    EF(Teich): 30 %    Ao root diam: 3.6 cm    LA dimension: 3.7 cm    LVOT diam: 2.3 cm    Doppler Measurements and Calculations    MV E point: 109 cm/sec    MV A point: 103 cm/sec    MV E/A: 1.1     MV P1/2t max vel: 109 cm/sec    MV P1/2t: 93 msec    MVA(P1/2t): 2.4 cm2    MV dec slope: 343 cm/sec2    MV dec time: 0.22 sec    Ao V2 max: 193 cm/sec    Ao max PG: 14 mmHg    Ao V2 mean: 111 cm/sec    Ao mean PG: 6.1 mmHg    Ao V2 VTI: 34 cm    AVA(I,D): 1.9 cm2    AVA(V,D): 1.7 cm2    AI max vel: 116 cm/sec    AI max PG: 5.0 mmHg    AI dec slope: 134 cm/sec2    AI P1/2t: 254 msec    LV max PG: 2.0 mmHg    LV mean PG: 1.00 mmHg    LV V1 max: 79 cm/sec    LV V1 mean: 45 cm/sec    LV V1 VTI: 15 cm    MR max vel: 358 cm/sec    MR max PG: 51 mmHg    SV(LVOT): 63 ml    PA V2 max: 107 cm/sec    PA max PG: 5.0 mmHg    PA acc time: 0.05 sec    TR Max vel: 244 cm/sec    TR Max PG: 23 mmHg    RVSP: 33 mmHg    RAP systole: 10 mmHg    PA pr(Accel): 57 mmHg    Reading Physician: Lujean Amel   Sonographer: Jaci Standard  Interpreting Physician:  Lujean Amel,  electronically signed on   10-29-2011 16:59:14  Requesting Physician: Lujean Amel   Assessment/Plan:  Invasive Device Daily Assessment of Necessity:   Does the patient currently have any of the following indwelling devices? foley  central line catheter  ventilator    Indwelling Urinary Catheter continued, requirement due to    Reason to continue Indwelling Urinary Catheter immobilization due to sedation/paralysis    Central Line continued, requirement due to    Reason to continue [employer] Access for frequent venous blood sampling    Ventilator continued, requirement due to    Reason to continue Ventilator failed daily assessment of readiness to wean   Assessment/Plan:   Assessment IMP CHF NQMI CM Resp  Failure HTN AoVR DM PVD Hyperlipidemia CRI/ARI .    Plan PLAN Continue vent support IV Antibx for cellutits Agree with ECHO Continue Statin Anticoug with heparin IV Lasix IV Wean off vent when able Continue DM  control Rec nephrology input F/U Ekgs Repeat troponins PVD as per Podiatry   Electronic Signatures: Lujean Amel D (MD)  (Signed 01-Jan-13 22:02)  Authored: Chief Complaint, VITAL SIGNS/ANCILLARY NOTES, Brief Assessment, Lab Results, Radiology Results, Assessment/Plan   Last Updated: 01-Jan-13 22:02 by Lujean Amel D (MD)
